# Patient Record
Sex: Female | Born: 1995 | Race: Black or African American | Hispanic: No | Marital: Single | Smoking: Never smoker
Health system: Southern US, Community
[De-identification: ages and names within clinical notes are randomized; demographics above are authoritative.]

## PROBLEM LIST (undated history)

## (undated) DIAGNOSIS — D500 Iron deficiency anemia secondary to blood loss (chronic): Secondary | ICD-10-CM

## (undated) DIAGNOSIS — F419 Anxiety disorder, unspecified: Secondary | ICD-10-CM

## (undated) DIAGNOSIS — F32A Depression, unspecified: Secondary | ICD-10-CM

## (undated) DIAGNOSIS — R0602 Shortness of breath: Secondary | ICD-10-CM

## (undated) DIAGNOSIS — L509 Urticaria, unspecified: Secondary | ICD-10-CM

## (undated) DIAGNOSIS — G4730 Sleep apnea, unspecified: Secondary | ICD-10-CM

## (undated) DIAGNOSIS — R7303 Prediabetes: Secondary | ICD-10-CM

## (undated) HISTORY — DX: Anxiety disorder, unspecified: F41.9

## (undated) HISTORY — DX: Prediabetes: R73.03

## (undated) HISTORY — DX: Urticaria, unspecified: L50.9

## (undated) HISTORY — DX: Sleep apnea, unspecified: G47.30

## (undated) HISTORY — DX: Iron deficiency anemia secondary to blood loss (chronic): D50.0

## (undated) HISTORY — PX: CLEFT LIP REPAIR: SUR1164

## (undated) HISTORY — DX: Shortness of breath: R06.02

## (undated) HISTORY — DX: Depression, unspecified: F32.A

---

## 2017-10-24 ENCOUNTER — Other Ambulatory Visit: Payer: Self-pay

## 2017-10-24 ENCOUNTER — Encounter (HOSPITAL_COMMUNITY): Payer: Self-pay

## 2017-10-24 ENCOUNTER — Emergency Department (HOSPITAL_COMMUNITY)
Admission: EM | Admit: 2017-10-24 | Discharge: 2017-10-24 | Disposition: A | Discharge status: Home / Self Care | Payer: Self-pay | Attending: Emergency Medicine | Admitting: Emergency Medicine

## 2017-10-24 DIAGNOSIS — N939 Abnormal uterine and vaginal bleeding, unspecified: Secondary | ICD-10-CM

## 2017-10-24 DIAGNOSIS — D649 Anemia, unspecified: Secondary | ICD-10-CM

## 2017-10-24 LAB — URINALYSIS, ROUTINE W REFLEX MICROSCOPIC
Bilirubin Urine: NEGATIVE
Glucose, UA: NEGATIVE mg/dL
Ketones, ur: NEGATIVE mg/dL
Leukocytes, UA: NEGATIVE
Nitrite: NEGATIVE
Protein, ur: NEGATIVE mg/dL
Specific Gravity, Urine: 1.016 (ref 1.005–1.030)
pH: 6 (ref 5.0–8.0)

## 2017-10-24 LAB — CBC
HCT: 32.5 % — ABNORMAL LOW (ref 36.0–46.0)
Hemoglobin: 7.7 g/dL — ABNORMAL LOW (ref 12.0–15.0)
MCH: 14.8 pg — ABNORMAL LOW (ref 26.0–34.0)
MCHC: 23.7 g/dL — ABNORMAL LOW (ref 30.0–36.0)
MCV: 62.6 fL — ABNORMAL LOW (ref 80.0–100.0)
Platelets: 384 10*3/uL (ref 150–400)
RBC: 5.19 MIL/uL — ABNORMAL HIGH (ref 3.87–5.11)
RDW: 20.4 % — ABNORMAL HIGH (ref 11.5–15.5)
WBC: 8.1 10*3/uL (ref 4.0–10.5)
nRBC: 0.5 % — ABNORMAL HIGH (ref 0.0–0.2)

## 2017-10-24 LAB — COMPREHENSIVE METABOLIC PANEL
ALT: 11 U/L (ref 0–44)
AST: 14 U/L — ABNORMAL LOW (ref 15–41)
Albumin: 3.6 g/dL (ref 3.5–5.0)
Alkaline Phosphatase: 78 U/L (ref 38–126)
Anion gap: 7 (ref 5–15)
BUN: 6 mg/dL (ref 6–20)
CO2: 25 mmol/L (ref 22–32)
Calcium: 9.1 mg/dL (ref 8.9–10.3)
Chloride: 105 mmol/L (ref 98–111)
Creatinine, Ser: 0.61 mg/dL (ref 0.44–1.00)
GFR calc Af Amer: >60 mL/min (ref >60)
GFR calc non Af Amer: >60 mL/min (ref >60)
Glucose, Bld: 92 mg/dL (ref 70–99)
Potassium: 4.2 mmol/L (ref 3.5–5.1)
Sodium: 137 mmol/L (ref 135–145)
Total Bilirubin: 0.7 mg/dL (ref 0.3–1.2)
Total Protein: 7.5 g/dL (ref 6.5–8.1)

## 2017-10-24 LAB — I-STAT BETA HCG BLOOD, ED (MC, WL, AP ONLY): I-stat hCG, quantitative: <5 m[IU]/mL (ref <5)

## 2017-10-24 LAB — LIPASE, BLOOD: Lipase: 21 U/L (ref 11–51)

## 2017-10-24 MED ORDER — FERROUS SULFATE 325 (65 FE) MG PO TABS: 325.0000 mg | ORAL_TABLET | Freq: Every day | ORAL | 0 refills | Status: DC

## 2017-10-24 NOTE — Discharge Instructions (Addendum)
Please read attached information. If you experience any new or worsening signs or symptoms please return to the emergency room for evaluation. Please follow-up with your primary care provider or specialist as discussed.  If your symptoms worsen including bleeding please return immediately.  please use medication prescribed only as directed and discontinue taking if you have any concerning signs or symptoms.

## 2017-10-24 NOTE — ED Provider Notes (Signed)
[facility] EMERGENCY DEPARTMENT Provider Note   CSN: [id number] Arrival date & time: 10/24/17  1508     History   Chief Complaint Chief Complaint  Patient presents with  . Vaginal Bleeding  . Abdominal Pain    HPI Shirley Briggs is a 22 y.o. female.  HPI   22 year old female presents today with complaints of vaginal bleeding.  Patient notes that she has had regular menstrual cycles up until September.  She notes she had a menstrual cycle that started at the beginning of September, but then had another one that started on September 30 first.  She notes these were typical of previous.  She again had a cycle that started today.  She notes that she has very minimal bleeding at this time, noting she has placed a pad but has no blood on it only blood when wiping..  She denies any chest pain or dizziness, denies any shortness of breath.  She denies any abnormal bruising or bleeding, weight loss or weight gain.  She notes she has been slightly more stressed recently.  Patient denies any recent medical care, she has been living in [place] for 2 years and has not seen a provider.  Patient notes she had very minimal epigastric pain earlier today, but has no abdominal pain or pelvic pain presently.  She notes her menstrual cycles are generally not uncomfortable.  History reviewed. No pertinent past medical history.  There are no active problems to display for this patient.   History reviewed. No pertinent surgical history.   OB History   None      Home Medications    Prior to Admission medications   Medication Sig Start Date End Date Taking? Authorizing Provider  ferrous sulfate 325 (65 FE) MG tablet Take 1 tablet (325 mg total) by mouth daily. 10/24/17   Eyvonne Mechanic, PA-C    Family History History reviewed. No pertinent family history.  Social History Social History   Tobacco Use  . Smoking status: Never Smoker  . Smokeless tobacco:  Never Used  Substance Use Topics  . Alcohol use: Never    Frequency: Never  . Drug use: Never     Allergies   Patient has no known allergies.   Review of Systems Review of Systems  All other systems reviewed and are negative.   Physical Exam Updated Vital Signs BP (!) 138/50 (BP Location: Right Arm)   Pulse 90   Temp 98.6 F (37 C) (Oral)   Resp 17   Ht 5\' 11"  (1.803 m)   Wt 106.6 kg   LMP 10/24/2017 (LMP Unknown)   SpO2 100%   BMI 32.78 kg/m   Physical Exam  Constitutional: She is oriented to person, place, and time. She appears well-developed and well-nourished.  HENT:  Head: Normocephalic and atraumatic.  Eyes: Pupils are equal, round, and reactive to light. Conjunctivae are normal. Right eye exhibits no discharge. Left eye exhibits no discharge. No scleral icterus.  Pink mucous membranes no pallor  Neck: Normal range of motion. No JVD present. No tracheal deviation present.  Pulmonary/Chest: Effort normal. No stridor.  Abdominal: Soft. She exhibits no distension and no mass. There is no tenderness. There is no rebound and no guarding. No hernia.  Neurological: She is alert and oriented to person, place, and time. Coordination normal.  Psychiatric: She has a normal mood and affect. Her behavior is normal. Judgment and thought content normal.  Nursing note and vitals reviewed.  ED Treatments / Results  Labs (all labs ordered are listed, but only abnormal results are displayed) Labs Reviewed  COMPREHENSIVE METABOLIC PANEL - Abnormal; Notable for the following components:      Result Value   AST 14 (*)    All other components within normal limits  CBC - Abnormal; Notable for the following components:   RBC 5.19 (*)    Hemoglobin 7.7 (*)    HCT 32.5 (*)    MCV 62.6 (*)    MCH 14.8 (*)    MCHC 23.7 (*)    RDW 20.4 (*)    nRBC 0.5 (*)    All other components within normal limits  URINALYSIS, ROUTINE W REFLEX MICROSCOPIC - Abnormal; Notable for the  following components:   APPearance HAZY (*)    Hgb urine dipstick LARGE (*)    Bacteria, UA FEW (*)    All other components within normal limits  LIPASE, BLOOD  DIFFERENTIAL  I-STAT BETA HCG BLOOD, ED ([facility], [facility], [facility] ONLY)    EKG None  Radiology No results found.  Procedures Procedures (including critical care time)  Medications Ordered in ED Medications - No data to display   Initial Impression / Assessment and Plan / ED Course  I have reviewed the triage vital signs and the nursing notes.  Pertinent labs & imaging results that were available during my care of the patient were reviewed by me and considered in my medical decision making (see chart for details).     Labs: I-STAT beta-hCG, lipase, CMP, CBC, UA  Imaging:  Consults:  Therapeutics:  Discharge Meds: Iron  Assessment/Plan: 22 year old female presents today with complaints of vaginal bleeding.  Patient reports that she has very minimal bleeding presently.  Patient has no abdominal pain, appears to be very well with no acute distress.  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  Patient's work-up significant for hemoglobin of 7.7, she has microcytic anemia.  Patient has not had any laboratory analysis since she was a child per her, so I have no hemoglobin to compare this to.  I do not feel this is acute as patient has no symptoms from this likely chronic in nature.  She is very anxious to leave the emergency room, I have discussed outpatient follow-up with case management patient will be able to seen as an outpatient for repeat evaluation and management.  I have encouraged her to follow-up with the [facility] for any worsening or continued bleeding, she will follow-up  as an outpatient with primary care for repeat laboratory analysis.  If patient continues to bleed and she is unable to follow-up as an outpatient she will return to emergency room for repeat laboratory analysis.  She will be discharged with iron supplements as well.  Patient will be discharged with strict return precautions, she verbalized understanding and agreement to today's plan had no further questions or concerns.   Final Clinical Impressions(s) / ED Diagnoses   Final diagnoses:  Anemia, unspecified type  Vaginal bleeding    ED Discharge Orders         Ordered    ferrous sulfate 325 (65 FE) MG tablet  Daily     10/24/17 1820  Eyvonne Mechanic, PA-C 10/24/17 1823    Raeford Razor, MD 10/25/17 [phone]

## 2017-10-24 NOTE — ED Triage Notes (Signed)
Pt herecomplaining of abdominal pain on the right side that goes into her flank.  Also stating she is having recurrent vaginal bleeding over the last 2 months. Last period ended Monday Oct 7th. Bleeding and pain started again [facility] Oct 10th.  No pain associated with period bleeding that ended Monday.

## 2017-11-06 ENCOUNTER — Ambulatory Visit: Payer: Self-pay | Attending: Family Medicine | Admitting: Physician Assistant

## 2017-11-06 VITALS — BP 122/74 | HR 84 | Temp 98.1°F | Resp 16 | Ht 71.0 in | Wt 284.2 lb

## 2017-11-06 DIAGNOSIS — R5383 Other fatigue: Secondary | ICD-10-CM | POA: Insufficient documentation

## 2017-11-06 DIAGNOSIS — Z09 Encounter for follow-up examination after completed treatment for conditions other than malignant neoplasm: Secondary | ICD-10-CM | POA: Insufficient documentation

## 2017-11-06 DIAGNOSIS — Z79899 Other long term (current) drug therapy: Secondary | ICD-10-CM | POA: Insufficient documentation

## 2017-11-06 DIAGNOSIS — D649 Anemia, unspecified: Secondary | ICD-10-CM

## 2017-11-06 DIAGNOSIS — N920 Excessive and frequent menstruation with regular cycle: Secondary | ICD-10-CM | POA: Insufficient documentation

## 2017-11-06 DIAGNOSIS — N921 Excessive and frequent menstruation with irregular cycle: Secondary | ICD-10-CM

## 2017-11-06 DIAGNOSIS — D509 Iron deficiency anemia, unspecified: Secondary | ICD-10-CM | POA: Insufficient documentation

## 2017-11-06 DIAGNOSIS — Z7689 Persons encountering health services in other specified circumstances: Secondary | ICD-10-CM | POA: Insufficient documentation

## 2017-11-06 HISTORY — DX: Anemia, unspecified: D64.9

## 2017-11-06 MED ORDER — FERROUS SULFATE 325 (65 FE) MG PO TABS: 325.0000 mg | ORAL_TABLET | Freq: Every day | ORAL | 5 refills | Status: DC

## 2017-11-06 NOTE — Progress Notes (Signed)
Patient ID: Shirley Briggs, female   DOB: Dec 04, 1995, 22 y.o.   MRN: [id number]     Shirley Briggs, is a 22 y.o. female  WUJ:[id number]  [id number]  DOB - 02-Feb-1995  Subjective:  Chief Complaint and HPI: Shirley Briggs is a 22 y.o. female here today to establish care and for a follow up visit After being seen in ED 10/24/2017 for heavy menses.  No further bleeding.  Hasn't seen gyn in a while.  No h/o anemia that she knows of.  She is taking iron once daily.  Periods usu regular and last 7 days with the first 2-3 days being moderate and last several days being light.  Pica with Ice for years  From ED note:  22 year old female presents today with complaints of vaginal bleeding.  Patient notes that she has had regular menstrual cycles up until September.  She notes she had a menstrual cycle that started at the beginning of September, but then had another one that started on September 30 first.  She notes these were typical of previous.  She again had a cycle that started today.  She notes that she has very minimal bleeding at this time, noting she has placed a pad but has no blood on it only blood when wiping..  She denies any chest pain or dizziness, denies any shortness of breath.  She denies any abnormal bruising or bleeding, weight loss or weight gain.  She notes she has been slightly more stressed recently.  Patient denies any recent medical care, she has been living in [place] for 2 years and has not seen a provider.  Patient notes she had very minimal epigastric pain earlier today, but has no abdominal pain or pelvic pain presently.  She notes her menstrual cycles are generally not uncomfortable.  From A/P: Assessment/Plan: 22 year old female presents today with complaints of vaginal bleeding.  Patient reports that she has very minimal bleeding presently.  Patient has no abdominal pain, appears to be very well with no acute distress.  Patient is a version, and has never  had pelvic exam previously, she reports very minimal bleeding at this time, I do not feel that proceeding with a pelvic exam would be beneficial in this situation as there is no reports of infectious etiology, and bleeding is minimal and similar to previous.  Patient's work-up significant for hemoglobin of 7.7, she has microcytic anemia.  Patient has not had any laboratory analysis since she was a child per her, so I have no hemoglobin to compare this to.  I do not feel this is acute as patient has no symptoms from this likely chronic in nature.  She is very anxious to leave the emergency room, I have discussed outpatient follow-up with case management patient will be able to seen as an outpatient for repeat evaluation and management.  I have encouraged her to follow-up with the [facility] for any worsening or continued bleeding, she will follow-up as an outpatient with primary care for repeat laboratory analysis.  If patient continues to bleed and she is unable to follow-up as an outpatient she will return to emergency room for repeat laboratory analysis.  She will be discharged with iron supplements as well.  Patient will be discharged with strict return precautions, she verbalized understanding and agreement to today's plan had no further questions or concerns.   ED/Hospital notes reviewed.   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 EENT:  No vision changes, No blurry vision, No hearing changes. No mouth, throat, or ear problems.  Respiratory: No cough, No SOB Cardiac: No CP, no palpitations GI:  No abd pain, No N/V/D. GU: No Urinary s/sx Musculoskeletal: No joint pain Neuro: No headache, no dizziness, no motor weakness.  Skin: No rash Endocrine:  No polydipsia. No polyuria.  Psych: Denies SI/HI  Problem  Anemia    ALLERGIES: No Known Allergies  PAST MEDICAL  HISTORY: History reviewed. No pertinent past medical history.  MEDICATIONS AT HOME: Prior to Admission medications   Medication Sig Start Date End Date Taking? Authorizing Provider  ferrous sulfate 325 (65 FE) MG tablet Take 1 tablet (325 mg total) by mouth daily. 10/24/17   Hedges, Tinnie Gens, PA-C     Objective:  EXAM:   Vitals:   11/06/17 1447  BP: 122/74  Pulse: 84  Resp: 16  Temp: 98.1 F (36.7 C)  TempSrc: Oral  SpO2: 99%  Weight: 284 lb 3.2 oz (128.9 kg)  Height: 5\' 11"  (1.803 m)    General appearance : A&OX3. NAD. Non-toxic-appearing HEENT: Atraumatic and Normocephalic.  PERRLA. EOM intact.  TM clear B. Mouth-MMM, post pharynx WNL w/o erythema, No PND. Neck: supple, no JVD. No cervical lymphadenopathy. No thyromegaly Chest/Lungs:  Breathing-non-labored, Good air entry bilaterally, breath sounds normal without rales, rhonchi, or wheezing  CVS: S1 S2 regular, no murmurs, gallops, rubs  Abdomen: Bowel sounds present, Non tender and not distended with no gaurding, rigidity or rebound. Extremities: Bilateral Lower Ext shows no edema, both legs are warm to touch with = pulse throughout Neurology:  CN II-XII grossly intact, Non focal.   Psych:  TP linear. J/I WNL. Normal speech. Appropriate eye contact and affect.  Skin:  No Rash  Data Review No results found for: HGBA1C   Assessment & Plan   1. Anemia, unspecified type Will check to see if improving.  Taking Iron once daily.  Drink 80-100 ounces of water daily - CBC with Differential/Platelet - Iron, TIBC and Ferritin Panel  2. Fatigue, unspecified type Long discussion(>30 mins face to face) on healthy diet, exercise, and adequate rest.  She eats a lot of carbs in her diet and is overweight.  I have recommended [facility] Diet to her - TSH  3. Encounter for examination following treatment at hospital improving  4. Menorrhagia with irregular cycle stable - Ambulatory referral to Gynecology   Patient have  been counseled extensively about nutrition and exercise  Return in about 2 months (around 01/06/2018) for assign PCP:  recheck anemia.  The patient was given clear instructions to go to ER or return to medical center if symptoms don't improve, worsen or new problems develop. The patient verbalized understanding. The patient was told to call to get lab results if they haven't heard anything in the next week.     Georgian Co, PA-C [facility] [place], [place] [phone]   11/06/2017, 3:12 PM

## 2017-11-06 NOTE — Patient Instructions (Addendum)
Drink 80-100 ounces water daily.    [facility] Diet has helpful guidelines

## 2017-11-07 LAB — CBC WITH DIFFERENTIAL/PLATELET
Basophils Absolute: 0 10*3/uL (ref 0.0–0.2)
Basos: 0 %
EOS (ABSOLUTE): 0.1 10*3/uL (ref 0.0–0.4)
EOS: 2 %
HEMATOCRIT: 29.8 % — AB (ref 34.0–46.6)
Hemoglobin: 7.9 g/dL — ABNORMAL LOW (ref 11.1–15.9)
Immature Grans (Abs): 0 10*3/uL (ref 0.0–0.1)
Immature Granulocytes: 0 %
LYMPHS ABS: 2.1 10*3/uL (ref 0.7–3.1)
Lymphs: 26 %
MCH: 16.2 pg — AB (ref 26.6–33.0)
MCHC: 26.5 g/dL — AB (ref 31.5–35.7)
MCV: 61 fL — AB (ref 79–97)
MONOS ABS: 0.5 10*3/uL (ref 0.1–0.9)
Monocytes: 6 %
Neutrophils Absolute: 5.4 10*3/uL (ref 1.4–7.0)
Neutrophils: 66 %
Platelets: 388 10*3/uL (ref 150–450)
RBC: 4.88 x10E6/uL (ref 3.77–5.28)
RDW: 21.9 % — AB (ref 12.3–15.4)
WBC: 8.2 10*3/uL (ref 3.4–10.8)

## 2017-11-07 LAB — IRON,TIBC AND FERRITIN PANEL
Ferritin: 6 ng/mL — ABNORMAL LOW (ref 15–150)
Iron Saturation: 4 % — CL (ref 15–55)
Iron: 13 ug/dL — ABNORMAL LOW (ref 27–159)
Total Iron Binding Capacity: 369 ug/dL (ref 250–450)
UIBC: 356 ug/dL (ref 131–425)

## 2017-11-07 LAB — TSH: TSH: 1.7 u[IU]/mL (ref 0.450–4.500)

## 2017-11-11 ENCOUNTER — Telehealth: Payer: Self-pay

## 2017-11-11 ENCOUNTER — Telehealth: Payer: Self-pay | Admitting: General Practice

## 2017-11-11 NOTE — Telephone Encounter (Signed)
I have never seen this patient. Please find out if she is talking about the iron pill/ferrous sulfate that was prescribed for her recently. She can take it any time of day. If she only missed one day then that would not have had a big impact on her lab results

## 2017-11-11 NOTE — Telephone Encounter (Signed)
Yes she is talking about the iron pill that prescribed.

## 2017-11-11 NOTE — Telephone Encounter (Signed)
Will forward to pcp. Pt has appointment scheduled with you on 02/06/2018

## 2017-11-11 NOTE — Telephone Encounter (Signed)
Patient called because she wanted to know when she should be taking her pills. She also didn't take her medicine the day she got her blood taken and wanted to know if that would affect the blood test results. Please follow up.

## 2017-11-11 NOTE — Telephone Encounter (Signed)
Contacted pt and went over lab results pt doesn't have any questions or concerns  

## 2017-11-18 ENCOUNTER — Telehealth: Payer: Self-pay | Admitting: General Practice

## 2017-11-18 NOTE — Telephone Encounter (Signed)
Patient called because she would like a refill for ferrous sulfate sent to [facility] on [address].

## 2017-11-19 NOTE — Telephone Encounter (Signed)
She should have an RX at [facility] is she wants its at [facility] she should ask them to transfer it.

## 2017-11-26 ENCOUNTER — Telehealth: Payer: Self-pay | Admitting: General Practice

## 2017-11-26 NOTE — Telephone Encounter (Signed)
Pt came in to request a medication script resent, she states that  -ferrous sulfate 325 (65 FE) MG tablet  Was not received by  -[facility] [phone]#[id number] - [place], [place] - [address] [facility] [address], Please follow up

## 2017-11-27 NOTE — Telephone Encounter (Signed)
Pharmacy did receive prescription, it was filled and returned to stock after 10 days. They will refill the prescription for her today, she should contact them with further questions or concerns.

## 2017-12-03 ENCOUNTER — Encounter: Payer: Self-pay | Admitting: Family Medicine

## 2017-12-03 ENCOUNTER — Ambulatory Visit: Payer: Self-pay | Attending: Family Medicine | Admitting: Family Medicine

## 2017-12-03 VITALS — BP 106/66 | HR 76 | Temp 98.8°F | Resp 18 | Ht 71.0 in | Wt 285.0 lb

## 2017-12-03 DIAGNOSIS — D500 Iron deficiency anemia secondary to blood loss (chronic): Secondary | ICD-10-CM

## 2017-12-03 DIAGNOSIS — N920 Excessive and frequent menstruation with regular cycle: Secondary | ICD-10-CM

## 2017-12-03 DIAGNOSIS — K5900 Constipation, unspecified: Secondary | ICD-10-CM | POA: Insufficient documentation

## 2017-12-03 NOTE — Patient Instructions (Signed)
Iron Deficiency Anemia, Adult Iron-deficiency anemia is when you have a low amount of red blood cells or hemoglobin. This happens because you have too little iron in your body. Hemoglobin carries oxygen to parts of the body. Anemia can cause your body to not get enough oxygen. It may or may not cause symptoms. Follow these instructions at home: Medicines  Take over-the-counter and prescription medicines only as told by your doctor. This includes iron pills (supplements) and vitamins.  If you cannot handle taking iron pills by mouth, ask your doctor about getting iron through: ? A vein (intravenously). ? A shot (injection) into a muscle.  Take iron pills when your stomach is empty. If you cannot handle this, take them with food.  Do not drink milk or take antacids at the same time as your iron pills.  To prevent trouble pooping (constipation), eat fiber or take medicine (stool softener) as told by your doctor. Eating and drinking  Talk with your doctor before changing the foods you eat. He or she may tell you to eat foods that have a lot of iron, such as: ? Liver. ? Lowfat (lean) beef. ? Breads and cereals that have iron added to them (fortified breads and cereals). ? Eggs. ? Dried fruit. ? Dark green, leafy vegetables.  Drink enough fluid to keep your pee (urine) clear or pale yellow.  Eat fresh fruits and vegetables that are high in vitamin C. They help your body to use iron. Foods with a lot of vitamin C include: ? Oranges. ? Peppers. ? Tomatoes. ? Mangoes. General instructions  Return to your normal activities as told by your doctor. Ask your doctor what activities are safe for you.  Keep yourself clean, and keep things clean around you (your surroundings). Anemia can make you get sick more easily.  Keep all follow-up visits as told by your doctor. This is important. Contact a doctor if:  You feel sick to your stomach (nauseous).  You throw up (vomit).  You feel  weak.  You are sweating for no clear reason.  You have trouble pooping, such as: ? Pooping (having a bowel movement) less than 3 times a week. ? Straining to poop. ? Having poop that is hard, dry, or larger than normal. ? Feeling full or bloated. ? Pain in the lower belly. ? Not feeling better after pooping. Get help right away if:  You pass out (faint). If this happens, do not drive yourself to the hospital. Call your local emergency services (911 in the [place]  You have chest pain.  You have shortness of breath that: ? Is very bad. ? Gets worse with physical activity.  You have a fast heartbeat.  You get light-headed when getting up from sitting or lying down. This information is not intended to replace advice given to you by your health care provider. Make sure you discuss any questions you have with your health care provider. Document Released: 02/03/2010 Document Revised: 09/21/2015 Document Reviewed: 09/21/2015 Elsevier Interactive Patient Education  2017 [employer].  Iron Deficiency Anemia, Adult Iron-deficiency anemia is when you have a low amount of red blood cells or hemoglobin. This happens because you have too little iron in your body. Hemoglobin carries oxygen to parts of the body. Anemia can cause your body to not get enough oxygen. It may or may not cause symptoms. Follow these instructions at home: Medicines  Take over-the-counter and prescription medicines only as told by your doctor. This includes iron pills (supplements) and  vitamins.  If you cannot handle taking iron pills by mouth, ask your doctor about getting iron through: ? A vein (intravenously). ? A shot (injection) into a muscle.  Take iron pills when your stomach is empty. If you cannot handle this, take them with food.  Do not drink milk or take antacids at the same time as your iron pills.  To prevent trouble pooping (constipation), eat fiber or take medicine (stool softener) as told by your  doctor. Eating and drinking  Talk with your doctor before changing the foods you eat. He or she may tell you to eat foods that have a lot of iron, such as: ? Liver. ? Lowfat (lean) beef. ? Breads and cereals that have iron added to them (fortified breads and cereals). ? Eggs. ? Dried fruit. ? Dark green, leafy vegetables.  Drink enough fluid to keep your pee (urine) clear or pale yellow.  Eat fresh fruits and vegetables that are high in vitamin C. They help your body to use iron. Foods with a lot of vitamin C include: ? Oranges. ? Peppers. ? Tomatoes. ? Mangoes. General instructions  Return to your normal activities as told by your doctor. Ask your doctor what activities are safe for you.  Keep yourself clean, and keep things clean around you (your surroundings). Anemia can make you get sick more easily.  Keep all follow-up visits as told by your doctor. This is important. Contact a doctor if:  You feel sick to your stomach (nauseous).  You throw up (vomit).  You feel weak.  You are sweating for no clear reason.  You have trouble pooping, such as: ? Pooping (having a bowel movement) less than 3 times a week. ? Straining to poop. ? Having poop that is hard, dry, or larger than normal. ? Feeling full or bloated. ? Pain in the lower belly. ? Not feeling better after pooping. Get help right away if:  You pass out (faint). If this happens, do not drive yourself to the hospital. Call your local emergency services (911 in the [place]  You have chest pain.  You have shortness of breath that: ? Is very bad. ? Gets worse with physical activity.  You have a fast heartbeat.  You get light-headed when getting up from sitting or lying down. This information is not intended to replace advice given to you by your health care provider. Make sure you discuss any questions you have with your health care provider. Document Released: 02/03/2010 Document Revised: 09/21/2015 Document  Reviewed: 09/21/2015 Elsevier Interactive Patient Education  2017 [employer]

## 2017-12-03 NOTE — Progress Notes (Signed)
Subjective:    Patient ID: Shirley Briggs, female    DOB: Aug 07, 1995, 22 y.o.   MRN: [id number]  HPI       22 year old female new to me as a patient but who has been seen on 11/06/2017 by another provider in the clinic in follow-up of ED visit on 10/24/2017 secondary to prolonged menstrual bleeding and patient was found to have anemia with a hemoglobin of 7.7.  At her most recent visit, patient had follow-up CBC and iron studies.  Patient was also referred to GYN in follow-up of menorrhagia.  Patient also had TSH done in follow-up of complaint of fatigue.  TSH was normal.  Patient however had continued anemia with a hemoglobin of 7.9 with MCV of 61 consistent with microcytic anemia.      Patient reports that she does have a history of heavy, prolonged menses.  Patient states her menstrual cycles generally last at least 7 days.  Patient states that her last menses was at the end of last month and therefore she should not have her period until the end of this month.  Patient was contacted and has a GYN appointment in early December.  Patient denies any chest pain, no shortness of breath related to anemia but patient does feels that she sometimes has swelling in her lower legs as well as fatigue.  Patient states that she was notified after her last visit that she needed to increase her iron pills to twice daily use which she is now doing.  Patient is starting to have some constipation related to her use of iron.  Patient denies any nausea.  Patient continues to crave ice.  Patient reports that her mother had issues with anemia but patient does not know the cause.  Patient believes that her mother's anemia was also related to menstrual bleeding as patient's mother did have a hysterectomy.  Patient is not aware of any family history of sickle cell anemia or sickle cell trait.  Patient states that her family history is significant for hypertension and CHF.   Past Medical History:  Diagnosis Date  .  Iron deficiency anemia due to chronic blood loss    Past Surgical History:  Procedure Laterality Date  . NO PAST SURGERIES     Family History  Problem Relation Age of Onset  . Anemia Mother   . Sickle cell anemia Neg Hx   . Sickle cell trait Neg Hx    Social History   Tobacco Use  . Smoking status: Never Smoker  . Smokeless tobacco: Never Used  Substance Use Topics  . Alcohol use: Never    Frequency: Never  . Drug use: Never  No Known Allergies   Review of Systems  Constitutional: Positive for fatigue. Negative for chills and fever.  Respiratory: Negative for cough and shortness of breath.   Cardiovascular: Positive for leg swelling. Negative for chest pain and palpitations.  Gastrointestinal: Negative for abdominal pain, blood in stool, diarrhea and nausea.  Endocrine: Negative for polydipsia, polyphagia and polyuria.  Genitourinary: Positive for menstrual problem. Negative for dysuria, frequency and hematuria.  Musculoskeletal: Negative for arthralgias, back pain, gait problem and joint swelling.  Neurological: Negative for dizziness, light-headedness and headaches.       Objective:   Physical Exam BP 106/66 (BP Location: Left Arm, Patient Position: Sitting, Cuff Size: Large)   Pulse 76   Temp 98.8 F (37.1 C) (Oral)   Resp 18   Ht 5\' 11"  (1.803 m)  Wt 285 lb (129.3 kg)   LMP 11/11/2017   SpO2 99%   BMI 39.75 kg/m Vital signs and nurse's notes reviewed General- obese, young adult female in no acute distress Neck-supple, patient with appearance of thyromegaly versus fat padding in the anterior neck Lungs-clear to auscultation bilaterally Cardiovascular-regular rate and rhythm, no tachycardia Abdomen-truncal obesity, soft and nontender Back-no CVA tenderness Extremities- patient with some mild puffiness/nonpitting edema in the distal lower extremities near the ankles        Assessment & Plan:  1. Iron deficiency anemia due to chronic blood loss Patient  is now taking her iron supplement twice daily.  Patient is encouraged to drink orange juice or take a vitamin C supplement along with her iron in order to increase absorption.  Patient with complaint of some constipation secondary to iron use and patient is encouraged to use an over-the-counter stool softener or laxative with stool softener as needed.  Patient should remain well-hydrated.  Patient will have repeat CBC to see if she has had improvement in her hemoglobin.  Patient reports that she believes she may be scheduled for follow-up appointment here next month which patient can go ahead and keep otherwise patient will be notified regarding the need for lab or office follow-up based on her results from today's blood work - CBC with Differential  2. Menorrhagia with regular cycle Discussed with patient that her heavy menses are the most likely cause of her iron deficiency anemia.  Patient does have upcoming appointment with GYN for further evaluation and treatment.  Patient will have CBC at today's visit in follow-up of heavy menses and anemia. - CBC with Differential  An After Visit Summary was printed and given to the patient.  Return for keep scheduled 1-2 month follow-up.

## 2017-12-04 LAB — CBC WITH DIFFERENTIAL/PLATELET
Basophils Absolute: 0 x10E3/uL (ref 0.0–0.2)
Basos: 0 %
EOS (ABSOLUTE): 0.1 x10E3/uL (ref 0.0–0.4)
Eos: 1 %
Hematocrit: 33.8 % — ABNORMAL LOW (ref 34.0–46.6)
Hemoglobin: 9.2 g/dL — ABNORMAL LOW (ref 11.1–15.9)
Immature Grans (Abs): 0 x10E3/uL (ref 0.0–0.1)
Immature Granulocytes: 0 %
Lymphocytes Absolute: 2 x10E3/uL (ref 0.7–3.1)
Lymphs: 24 %
MCH: 17.6 pg — ABNORMAL LOW (ref 26.6–33.0)
MCHC: 27.2 g/dL — ABNORMAL LOW (ref 31.5–35.7)
MCV: 65 fL — ABNORMAL LOW (ref 79–97)
Monocytes Absolute: 0.4 x10E3/uL (ref 0.1–0.9)
Monocytes: 5 %
Neutrophils Absolute: 5.8 x10E3/uL (ref 1.4–7.0)
Neutrophils: 70 %
Platelets: 393 x10E3/uL (ref 150–450)
RBC: 5.22 x10E6/uL (ref 3.77–5.28)
RDW: 23.6 % — ABNORMAL HIGH (ref 12.3–15.4)
WBC: 8.4 x10E3/uL (ref 3.4–10.8)

## 2017-12-06 ENCOUNTER — Telehealth: Payer: Self-pay | Admitting: *Deleted

## 2017-12-06 NOTE — Telephone Encounter (Signed)
-----   Message from Cain Saupeammie Fulp, MD sent at 12/04/2017  8:02 PM EST ----- Please notify patient that her hemoglobin has improved from 7.9 and is now at 9.2.  Patient should continue twice daily iron therapy and keep her upcoming appointment with gynecology

## 2017-12-06 NOTE — Telephone Encounter (Signed)
Patient verified DOB Patient is aware of iron improving and to continue with 2 supplements daily and follow up with gynecology. No further questions.

## 2017-12-22 ENCOUNTER — Encounter: Payer: Self-pay | Admitting: Physician Assistant

## 2017-12-23 ENCOUNTER — Ambulatory Visit (INDEPENDENT_AMBULATORY_CARE_PROVIDER_SITE_OTHER): Payer: Self-pay | Admitting: Family Medicine

## 2017-12-23 ENCOUNTER — Encounter: Payer: Self-pay | Admitting: Family Medicine

## 2017-12-23 DIAGNOSIS — D500 Iron deficiency anemia secondary to blood loss (chronic): Secondary | ICD-10-CM

## 2017-12-23 NOTE — Progress Notes (Signed)
   Subjective:    Patient ID: Shirley Briggs is a 22 y.o. female presenting with Metrorrhagia  on 12/23/2017  HPI: New patient referral for anemia. Got hgb down to 7.7. Seen in ED and started on iron. Reports cycle lasted 2 days longer than usual one month. Reports cycles are monthly and last 7 days. No change in cycle. Notes that 1st three days is moderate then usually lightens up. She is not sexually active. Works for [employer].  Review of Systems  Constitutional: Negative for chills and fever.  Respiratory: Negative for shortness of breath.   Cardiovascular: Negative for chest pain.  Gastrointestinal: Negative for abdominal pain, nausea and vomiting.  Genitourinary: Negative for dysuria.  Skin: Negative for rash.      Objective:    BP 102/70   Pulse 81   Wt 283 lb 6.4 oz (128.5 kg)   LMP 12/13/2017 (Exact Date)   BMI 39.53 kg/m  Physical Exam  Constitutional: She is oriented to person, place, and time. She appears well-developed and well-nourished. No distress.  HENT:  Head: Normocephalic and atraumatic.  Eyes: No scleral icterus.  Neck: Neck supple.  Cardiovascular: Normal rate.  Pulmonary/Chest: Effort normal.  Abdominal: Soft.  Neurological: She is alert and oriented to person, place, and time.  Skin: Skin is warm and dry.  Psychiatric: She has a normal mood and affect.        Assessment & Plan:   Problem List Items Addressed This Visit      Unprioritized   Anemia    Slowly improving--? Related to menorrhagia--she does not give a hx that sounds like there is a problem with this. Offered OC's, but as she is not currently sexually active, unlikely to need for contraception. If bleeding becomes more irregular or more heavy, this is an option. Could she have beta thal or some other hemoglobinopathy.         Total face-to-face time with patient: 20 minutes. Over 50% of encounter was spent on counseling and coordination of care. Return if symptoms  worsen or fail to improve.  Shirley Briggs 12/23/2017 2:44 PM

## 2017-12-23 NOTE — Patient Instructions (Signed)

## 2017-12-24 ENCOUNTER — Encounter: Payer: Self-pay | Admitting: Family Medicine

## 2017-12-24 ENCOUNTER — Telehealth: Payer: Self-pay | Admitting: Family Medicine

## 2017-12-24 NOTE — Telephone Encounter (Signed)
Pt request refill ferrous sulfate  (IRON) sent to [facility] on [address] in [place]. Pt ph [phone].

## 2017-12-24 NOTE — Assessment & Plan Note (Addendum)
Slowly improving--? Related to menorrhagia--she does not give a hx that sounds like there is a problem with this. Offered OC's, but as she is not currently sexually active, unlikely to need for contraception. If bleeding becomes more irregular or more heavy, this is an option. Could she have beta thal or some other hemoglobinopathy.

## 2017-12-24 NOTE — Telephone Encounter (Signed)
The patient should have enough refills at that [facility] to last until the end of March 2020. She should contact the pharmacy directly. Thank you.

## 2018-02-06 ENCOUNTER — Ambulatory Visit: Payer: Self-pay | Attending: Family Medicine | Admitting: Family Medicine

## 2018-02-06 ENCOUNTER — Encounter: Payer: Self-pay | Admitting: Family Medicine

## 2018-02-06 VITALS — BP 136/69 | HR 101 | Temp 99.2°F | Ht 71.0 in | Wt 296.0 lb

## 2018-02-06 DIAGNOSIS — N920 Excessive and frequent menstruation with regular cycle: Secondary | ICD-10-CM

## 2018-02-06 DIAGNOSIS — D500 Iron deficiency anemia secondary to blood loss (chronic): Secondary | ICD-10-CM

## 2018-02-06 DIAGNOSIS — R635 Abnormal weight gain: Secondary | ICD-10-CM

## 2018-02-06 DIAGNOSIS — M25562 Pain in left knee: Secondary | ICD-10-CM

## 2018-02-06 DIAGNOSIS — J069 Acute upper respiratory infection, unspecified: Secondary | ICD-10-CM

## 2018-02-06 DIAGNOSIS — K0889 Other specified disorders of teeth and supporting structures: Secondary | ICD-10-CM

## 2018-02-06 MED ORDER — IBUPROFEN 600 MG PO TABS: 600.0000 mg | ORAL_TABLET | Freq: Three times a day (TID) | ORAL | 0 refills | Status: DC | PRN

## 2018-02-06 MED ORDER — AMOXICILLIN 500 MG PO CAPS: 500.0000 mg | ORAL_CAPSULE | Freq: Two times a day (BID) | ORAL | 0 refills | Status: AC

## 2018-02-06 NOTE — Patient Instructions (Signed)

## 2018-02-06 NOTE — Progress Notes (Signed)
Subjective:    Patient ID: Shirley Briggs, female    DOB: 09-15-1995, 23 y.o.   MRN: [id number]  HPI        23 yo female who is seen in follow-up of anemia. Patient was seen 12/24/27 by OB/GYN in follow-up of menorrhagia which is thought to be the cause of her anemia. Patient's last CBC was done 12/03/2017 and her Hgb was 9.2 at that time. She is taking iron supplements and feel less fatigued. She was offered oral contraceptives by GYN to help with her heavy menses but she declined as she is sexually active.        She reports that she does have a few other issues at today's visit.  Patient states that she has had recent onset within the past 2 weeks of some pain in her left knee and she sometimes feels as if her knee is slightly swollen.  She denies any injury to her knee.  Knee pain is dull and usually occurs with walking or when she initially gets up from a seated position.  Pain is located across the middle of her kneecap.  Patient does have to do some bending and lifting as she works at [employer].  Pain is about a 3-4 on a 0-to-10 scale.  She has also had some recurrent tooth pain for which she needs to see a dentist.  Patient states that she feels as if there has been some swelling and pain in 1 of her right upper teeth and pain increases with eating/chewing food on the right side of her mouth.  She also feels as if she has had some recent increase in nasal congestion, nonproductive cough which started about 5 days ago.  No fever or chills.  No headaches or dizziness.  Nasal discharge has so far been clear.  Patient also feels that she is gaining weight without any real changes in her diet or activity level.  Patient admits though that she may be eating more fast food and she does not exercise on a regular basis.  Past Medical History:  Diagnosis Date  . Iron deficiency anemia due to chronic blood loss    Past Surgical History:  Procedure Laterality Date  . CLEFT LIP REPAIR      Family History  Problem Relation Age of Onset  . Anemia Mother   . Anemia Maternal Grandmother   . Heart disease Maternal Grandmother   . Sickle cell anemia Neg Hx   . Sickle cell trait Neg Hx    Social History   Tobacco Use  . Smoking status: Never Smoker  . Smokeless tobacco: Never Used  Substance Use Topics  . Alcohol use: Never    Frequency: Never  . Drug use: Never   No Known Allergies  Review of Systems  Constitutional: Positive for fatigue (mild, improved) and unexpected weight change. Negative for chills and fever.  HENT: Positive for congestion, dental problem, postnasal drip and rhinorrhea. Negative for ear pain, nosebleeds, sinus pressure, sinus pain, sore throat and trouble swallowing.   Eyes: Negative for photophobia and visual disturbance.  Respiratory: Positive for cough (mild, non-productive). Negative for shortness of breath.   Cardiovascular: Negative for chest pain and palpitations.  Gastrointestinal: Negative for abdominal pain, blood in stool, constipation, diarrhea and nausea.  Endocrine: Negative for cold intolerance, heat intolerance, polydipsia, polyphagia and polyuria.  Genitourinary: Positive for frequency. Negative for dysuria.  Musculoskeletal: Positive for arthralgias and joint swelling. Negative for gait problem.  Neurological:  Negative for dizziness and headaches.  Hematological: Negative for adenopathy. Does not bruise/bleed easily.  Psychiatric/Behavioral: Negative for dysphoric mood, self-injury and suicidal ideas. The patient is not nervous/anxious.        Objective:   Physical Exam Vitals signs and nursing note reviewed.  Constitutional:      General: She is not in acute distress.    Appearance: Normal appearance. She is obese.     Comments: Has a slight nasal quality, mild hoarseness to speaking voice  HENT:     Head: Normocephalic and atraumatic.     Right Ear: Tympanic membrane, ear canal and external ear normal.     Left Ear:  Tympanic membrane, ear canal and external ear normal.     Ears:     Comments: TM's are slightly dull but landmarks are visible    Nose: Congestion (mild) and rhinorrhea (clear) present.     Mouth/Throat:     Mouth: Mucous membranes are moist.     Pharynx: Oropharynx is clear. Posterior oropharyngeal erythema (mild) present. No oropharyngeal exudate.     Comments: Patient with some edema/erythema of gum surrounding right posterior molar; area tender when touched with tongue depressor Eyes:     Extraocular Movements: Extraocular movements intact.     Conjunctiva/sclera: Conjunctivae normal.  Neck:     Musculoskeletal: Normal range of motion and neck supple.  Cardiovascular:     Rate and Rhythm: Normal rate and regular rhythm.  Pulmonary:     Effort: Pulmonary effort is normal. No respiratory distress.     Breath sounds: Normal breath sounds. No rhonchi.  Abdominal:     General: Bowel sounds are normal.     Tenderness: There is no abdominal tenderness. There is no right CVA tenderness, left CVA tenderness, guarding or rebound.  Musculoskeletal:        General: Tenderness (mld tenderness over the mid and lower patella of the left knee) present.     Right lower leg: No edema.     Left lower leg: No edema.     Comments: No effusion or swelling of the knees and both knee joints are stable and intact  Lymphadenopathy:     Cervical: No cervical adenopathy.  Skin:    General: Skin is warm and dry.  Neurological:     General: No focal deficit present.     Mental Status: She is alert and oriented to person, place, and time.  Psychiatric:        Mood and Affect: Mood normal.        Judgment: Judgment normal.    BP 136/69 (BP Location: Left Arm, Patient Position: Sitting, Cuff Size: Large)   Pulse (!) 101   Temp 99.2 F (37.3 C) (Oral)   Ht 5\' 11"  (1.803 m)   Wt 296 lb (134.3 kg)   LMP 01/12/2018 (Approximate)   SpO2 100%   BMI 41.28 kg/m         Assessment & Plan:  1. Iron  deficiency anemia due to chronic blood loss Patient will have CBC in follow-up of her anemia and patient is to continue the use of iron therapy but will be notified of changes are needed and iron therapy based on today's CBC results.  - CBC with Differential - Ambulatory referral to Social Work-patient with abnormal PHQ 9 score on depression screen and will be referred to social work for further evaluation and treatment if needed.  Patient denies any depression and no suicidal thoughts or ideations  2.  Menorrhagia with regular cycle Patient was seen by OB/GYN since her last visit.  Patient was offered OCPs which patient declined.  Patient feels that her current menses are not as heavy but in the past she has had menses that were especially heavy on the first 3 days and would last for about 7 days.  Patient's note from GYN consult reviewed. - CBC with Differential  3. Tooth pain Patient with complaint of tooth pain and patient is encouraged to follow-up with a dentist.  Prescription provided for ibuprofen and amoxicillin and patient should call or return if symptoms worsen if she is not able to obtain an appointment with a dentist. - ibuprofen (ADVIL,MOTRIN) 600 MG tablet; Take 1 tablet (600 mg total) by mouth every 8 (eight) hours as needed for moderate pain.  Dispense: 60 tablet; Refill: 0 - amoxicillin (AMOXIL) 500 MG capsule; Take 1 capsule (500 mg total) by mouth 2 (two) times daily for 10 days.  Dispense: 20 capsule; Refill: 0  4. Acute pain of left knee Patient with acute left knee pain.  Patient may have some mild patellar tendinitis due to work-related bending.  Patient was provided with prescription for ibuprofen which she may take for the knee pain as well as her tooth ache.  Patient may use cold compresses/ice pack to the knee over protected skin if she notices swelling.  Patient should call or return to clinic if pain does not go away within the next 2 weeks or if she continues to have  recurrent pain and swelling.  5. Upper respiratory tract infection, unspecified type Patient with URI symptoms and she may take over-the-counter medications for congestion/postnasal drainage  6. Weight gain, abnormal Patient feels as if she has had an abnormal amount of weight gain but upon further discussion, patient may also be eating more processed foods and not getting exercise on a regular basis.  We will also check TSH to make sure that she does not have hypothyroidism that is contributing to her weight gain. - TSH  An After Visit Summary was printed and given to the patient.  Allergies as of 02/06/2018   No Known Allergies     Medication List       Accurate as of February 06, 2018 11:59 PM. Always use your most recent med list.        amoxicillin 500 MG capsule Commonly known as:  AMOXIL Take 1 capsule (500 mg total) by mouth 2 (two) times daily for 10 days.   ferrous sulfate 325 (65 FE) MG tablet Take 1 tablet (325 mg total) by mouth daily with breakfast.   ibuprofen 600 MG tablet Commonly known as:  ADVIL,MOTRIN Take 1 tablet (600 mg total) by mouth every 8 (eight) hours as needed for moderate pain.       Return in about 3 months (around 05/08/2018) for anemia.  Follow-up

## 2018-02-07 LAB — CBC WITH DIFFERENTIAL/PLATELET
Basophils Absolute: 0 x10E3/uL (ref 0.0–0.2)
Basos: 0 %
EOS (ABSOLUTE): 0.2 x10E3/uL (ref 0.0–0.4)
Eos: 2 %
Hematocrit: 39.5 % (ref 34.0–46.6)
Hemoglobin: 11.6 g/dL (ref 11.1–15.9)
Immature Grans (Abs): 0 x10E3/uL (ref 0.0–0.1)
Immature Granulocytes: 0 %
Lymphocytes Absolute: 2.3 x10E3/uL (ref 0.7–3.1)
Lymphs: 25 %
MCH: 20.7 pg — ABNORMAL LOW (ref 26.6–33.0)
MCHC: 29.4 g/dL — ABNORMAL LOW (ref 31.5–35.7)
MCV: 70 fL — ABNORMAL LOW (ref 79–97)
Monocytes Absolute: 0.7 x10E3/uL (ref 0.1–0.9)
Monocytes: 8 %
Neutrophils Absolute: 5.7 x10E3/uL (ref 1.4–7.0)
Neutrophils: 65 %
Platelets: 321 x10E3/uL (ref 150–450)
RBC: 5.61 x10E6/uL — ABNORMAL HIGH (ref 3.77–5.28)
RDW: 19.6 % — ABNORMAL HIGH (ref 11.7–15.4)
WBC: 8.9 x10E3/uL (ref 3.4–10.8)

## 2018-02-07 LAB — TSH: TSH: 1.23 u[IU]/mL (ref 0.450–4.500)

## 2018-02-12 ENCOUNTER — Telehealth: Payer: Self-pay | Admitting: *Deleted

## 2018-02-12 NOTE — Telephone Encounter (Signed)
-----   Message from Cain Saupe, MD sent at 02/10/2018 11:03 PM EST ----- Please notify patient of normal TSH

## 2018-02-12 NOTE — Telephone Encounter (Signed)
Patient verified DOB Patient is aware TSH being normal and anemia improving but MCV still showing small cell size so to continue supplement for another 2-3 weeks and complete a recheck in office. No further questions.

## 2018-03-31 ENCOUNTER — Other Ambulatory Visit: Payer: Self-pay | Admitting: Physician Assistant

## 2018-04-23 ENCOUNTER — Other Ambulatory Visit: Payer: Self-pay | Admitting: Physician Assistant

## 2018-05-08 ENCOUNTER — Other Ambulatory Visit: Payer: Self-pay

## 2018-05-08 ENCOUNTER — Ambulatory Visit: Payer: Self-pay | Attending: Family Medicine | Admitting: Family Medicine

## 2018-05-08 ENCOUNTER — Encounter: Payer: Self-pay | Admitting: Family Medicine

## 2018-05-08 DIAGNOSIS — N920 Excessive and frequent menstruation with regular cycle: Secondary | ICD-10-CM

## 2018-05-08 DIAGNOSIS — D500 Iron deficiency anemia secondary to blood loss (chronic): Secondary | ICD-10-CM

## 2018-05-08 DIAGNOSIS — G479 Sleep disorder, unspecified: Secondary | ICD-10-CM

## 2018-05-08 DIAGNOSIS — J309 Allergic rhinitis, unspecified: Secondary | ICD-10-CM

## 2018-05-08 MED ORDER — CETIRIZINE HCL 10 MG PO TABS: 10.0000 mg | ORAL_TABLET | Freq: Every day | ORAL | 6 refills | Status: DC

## 2018-05-08 NOTE — Progress Notes (Signed)
Would like to talk about her Anemia. Per pt she thinks she have sinus problems because her nose runs a lot   Patient is having sleep problem. Per pt she is having trouble falling asleep

## 2018-05-08 NOTE — Progress Notes (Signed)
Virtual Visit via Telephone Note  I connected with Shirley Briggs on 05/08/18 at  2:10 PM EDT by telephone and verified that I am speaking with the correct person using two identifiers.   I discussed the limitations, risks, security and privacy concerns of performing an evaluation and management service by telephone and the availability of in person appointments. I also discussed with the patient that there may be a patient responsible charge related to this service. The patient expressed understanding and agreed to proceed.  Patient Location: Home Provider Location: Office Others participating in call: [place], CMA who initiated call   History of Present Illness:      23 yo female with issues with heavy menstrual cycles but menses remain regular. She has some mild fatigue but thinks that this is more so related to issues with sleep. Because of the COVID-19 quarantine she has been staying up later and is having trouble with a regular sleep schedule. She is taking iron to help with anemia  But currently only once per day. She has had an increase in nasal allergy symptoms and would like something to help with nasal congestion and postnasal drainage. She denies any sore throat and no fever or chills. No discolored nasal drainage.    Past Medical History:  Diagnosis Date  . Iron deficiency anemia due to chronic blood loss     Past Surgical History:  Procedure Laterality Date  . CLEFT LIP REPAIR      Family History  Problem Relation Age of Onset  . Anemia Mother   . Anemia Maternal Grandmother   . Heart disease Maternal Grandmother   . Sickle cell anemia Neg Hx   . Sickle cell trait Neg Hx     Social History   Tobacco Use  . Smoking status: Never Smoker  . Smokeless tobacco: Never Used  Substance Use Topics  . Alcohol use: Never    Frequency: Never  . Drug use: Never     No Known Allergies   Review of Systems  Constitutional: Positive for malaise/fatigue (mild).  Negative for chills and fever.  HENT: Positive for congestion. Negative for ear pain, sinus pain and sore throat.   Respiratory: Negative for cough and shortness of breath.   Cardiovascular: Negative for chest pain and palpitations.  Gastrointestinal: Negative for abdominal pain, constipation, diarrhea, nausea and vomiting.  Genitourinary: Negative for dysuria and frequency.  Musculoskeletal: Negative for joint pain and myalgias.  Skin: Negative for itching and rash.  Neurological: Negative for dizziness and headaches.  Endo/Heme/Allergies: Negative for polydipsia. Does not bruise/bleed easily.      Observations/Objective: No vital signs or physical exam conducted as visit was done via telephone  Assessment and Plan: 1. Iron deficiency anemia due to chronic blood loss Patient can come in at her convenience in the next few weeks for a CBC in follow-up of anemia. Discussed with patient that her Hgb in Jan of this year was at 11.6 which had improved from 9.2 on 1//19/19.  - CBC with Differential; Future  2. Menorrhagia with regular cycle Patient reports that her periods are not quite as heavy. She is not interested in any interventions/medications at this time for her menorrhagia.  - CBC with Differential; Future  3. Allergic rhinitis, unspecified seasonality, unspecified trigger RX for patient to try cetirizine for her allergy rhinitis symptoms and since this medication can cause drowsiness, she may want to take it in the evenings as this may also help with her current sleep  issues - cetirizine (ZYRTEC) 10 MG tablet; Take 1 tablet (10 mg total) by mouth daily. In the evening to help with nasal congestion/runny nose  Dispense: 30 tablet; Refill: 6  4. Sleeping difficulty Sleep hygiene reviewed with the patient and she was encouraged to establish a routine bedtime and awakening time and make sure that her bedroom is dark and quiet.   Follow Up Instructions:Return for anemia- 6 weeks for  labs.    I discussed the assessment and treatment plan with the patient. The patient was provided an opportunity to ask questions and all were answered. The patient agreed with the plan and demonstrated an understanding of the instructions.   The patient was advised to call back or seek an in-person evaluation if the symptoms worsen or if the condition fails to improve as anticipated.  I provided 12  minutes of non-face-to-face time during this encounter.   Cain Saupeammie Elke Holtry, MD

## 2018-05-17 ENCOUNTER — Encounter: Payer: Self-pay | Admitting: Family Medicine

## 2018-07-17 ENCOUNTER — Telehealth: Payer: Self-pay | Admitting: Family Medicine

## 2018-07-17 NOTE — Telephone Encounter (Signed)
Pt states she hasn't had her menstruation since may 25th has concerns and would like to discuss this with the nurse, I've made an appt already please follow up

## 2018-07-17 NOTE — Telephone Encounter (Signed)
Spoke with patient and she is not having any sx right now but do understand that if she starts to have any bleeding or severe abd pain to go to urgent care or emergency room. Patient did confirmed appt

## 2018-07-30 ENCOUNTER — Ambulatory Visit: Payer: Self-pay | Attending: Family Medicine | Admitting: Family Medicine

## 2018-07-30 ENCOUNTER — Encounter: Payer: Self-pay | Admitting: Family Medicine

## 2018-07-30 ENCOUNTER — Other Ambulatory Visit: Payer: Self-pay

## 2018-07-30 VITALS — BP 114/83 | HR 78 | Temp 98.6°F | Resp 16 | Wt 321.6 lb

## 2018-07-30 DIAGNOSIS — D500 Iron deficiency anemia secondary to blood loss (chronic): Secondary | ICD-10-CM

## 2018-07-30 DIAGNOSIS — N926 Irregular menstruation, unspecified: Secondary | ICD-10-CM

## 2018-07-30 DIAGNOSIS — Z6841 Body Mass Index (BMI) 40.0 and over, adult: Secondary | ICD-10-CM

## 2018-07-30 NOTE — Patient Instructions (Signed)
Polycystic Ovarian Syndrome  Polycystic ovarian syndrome (PCOS) is a common hormonal disorder among women of reproductive age. In most women with PCOS, many small fluid-filled sacs (cysts) grow on the ovaries, and the cysts are not part of a normal menstrual cycle. PCOS can cause problems with your menstrual periods and make it difficult to get pregnant. It can also cause an increased risk of miscarriage with pregnancy. If it is not treated, PCOS can lead to serious health problems, such as diabetes and heart disease. What are the causes? The cause of PCOS is not known, but it may be the result of a combination of certain factors, such as:  Irregular menstrual cycle.  High levels of certain hormones (androgens).  Problems with the hormone that helps to control blood sugar (insulin resistance).  Certain genes. What increases the risk? This condition is more likely to develop in women who have a family history of PCOS. What are the signs or symptoms? Symptoms of PCOS may include:  Multiple ovarian cysts.  Infrequent periods or no periods.  Periods that are too frequent or too heavy.  Unpredictable periods.  Inability to get pregnant (infertility) because of not ovulating.  Increased growth of hair on the face, chest, stomach, back, thumbs, thighs, or toes.  Acne or oily skin. Acne may develop during adulthood, and it may not respond to treatment.  Pelvic pain.  Weight gain or obesity.  Patches of thickened and dark brown or black skin on the neck, arms, breasts, or thighs (acanthosis nigricans).  Excess hair growth on the face, chest, abdomen, or upper thighs (hirsutism). How is this diagnosed? This condition is diagnosed based on:  Your medical history.  A physical exam, including a pelvic exam. Your health care provider may look for areas of increased hair growth on your skin.  Tests, such as: ? Ultrasound. This may be used to examine the ovaries and the lining of the  uterus (endometrium) for cysts. ? Blood tests. These may be used to check levels of sugar (glucose), female hormone (testosterone), and female hormones (estrogen and progesterone) in your blood. How is this treated? There is no cure for PCOS, but treatment can help to manage symptoms and prevent more health problems from developing. Treatment varies depending on:  Your symptoms.  Whether you want to have a baby or whether you need birth control (contraception). Treatment may include nutrition and lifestyle changes along with:  Progesterone hormone to start a menstrual period.  Birth control pills to help you have regular menstrual periods.  Medicines to make you ovulate, if you want to get pregnant.  Medicine to reduce excessive hair growth.  Surgery, in severe cases. This may involve making small holes in one or both of your ovaries. This decreases the amount of testosterone that your body produces. Follow these instructions at home:  Take over-the-counter and prescription medicines only as told by your health care provider.  Follow a healthy meal plan. This can help you reduce the effects of PCOS. ? Eat a healthy diet that includes lean proteins, complex carbohydrates, fresh fruits and vegetables, low-fat dairy products, and healthy fats. Make sure to eat enough fiber.  If you are overweight, lose weight as told by your health care provider. ? Losing 10% of your body weight may improve symptoms. ? Your health care provider can determine how much weight loss is best for you and can help you lose weight safely.  Keep all follow-up visits as told by your health care provider.  This is important. Contact a health care provider if:  Your symptoms do not get better with medicine.  You develop new symptoms. This information is not intended to replace advice given to you by your health care provider. Make sure you discuss any questions you have with your health care provider. Document  Released: 04/27/2004 Document Revised: 12/14/2016 Document Reviewed: 06/19/2015 Elsevier Patient Education  2020 [place].  Secondary Amenorrhea  Secondary amenorrhea occurs when a female who was previously having menstrual periods has not had them for 3-6 months. A menstrual period is the monthly shedding of the lining of the uterus. Menstruation involves the passing of blood, tissue, fluid, and mucus through the vagina. The flow of blood usually occurs during 3-7 consecutive days each month. This condition has many causes. In many cases, treating the underlying cause will return menstrual periods back to a normal cycle. What are the causes? The most common cause of this condition is pregnancy. Other causes include:  Malnutrition.  Cirrhosis of the liver.  Conditions of the blood.  Diabetes.  Epilepsy.  Chronic kidney disease.  Polycystic ovary disease.  Stress or anxiety.  A hormonal imbalance.  Ovarian failure.  Medicines.  Extreme obesity.  Cystic fibrosis.  Low body weight or drastic weight loss.  Early menopause.  Removal of the ovaries or uterus.  Contraceptive pills, patches, or vaginal rings.  Cushing syndrome.  Thyroid problems. What increases the risk? You are more likely to develop this condition if:  You have a family history of this condition.  You have an eating disorder.  You do extreme athletic training.  You have a chronic disease.  You abuse substances such as alcohol or cigarettes. What are the signs or symptoms? The main symptom of this condition is a lack of menstrual periods for 3-6 months. How is this diagnosed? This condition may be diagnosed based on:  Your medical history.  A physical exam.  A pelvic exam to check for problems with your reproductive organs.  A procedure to examine the uterus.  A measurement of your body mass index (BMI).  Tests, such as: ? Blood tests that measure certain hormones in your body  and rule out pregnancy. ? Urine tests. ? Imaging tests, such as an ultrasound, CT scan, or MRI. How is this treated? Treatment for this condition depends on the cause of the amenorrhea. It may involve:  Correcting dietary problems.  Treating underlying conditions.  Medicines.  Lifestyle changes.  Surgery. If the condition cannot be corrected, it is sometimes possible to trigger menstrual periods with medicines. Follow these instructions at home: Lifestyle  Maintain a healthy diet. Ask to meet with a registered dietitian for nutrition counseling and meal planning.  Maintain a healthy weight. Talk to your health care provider before trying any new diet or exercise plan.  Exercise at least 30 minutes 5 or more days each week. Exercising includes brisk walking, yard work, biking, running, swimming, and team sports like basketball and soccer. Ask your health care provider which exercises are safe for you.  Get enough sleep. Plan your sleep time to allow for 7-9 hours of sleep each night.  Learn to manage stress. Explore relaxation techniques such as meditation, journaling, yoga, or tai chi. General instructions  Be aware of changes in your menstrual cycle. Keep a record of when you have your menstrual period. Note the date your period starts, how long it lasts, and any problems you experience.  Take over-the-counter and prescription medicines only as told  by your health care provider.  Keep all follow-up visits as told by your health care provider. This is important. Contact a health care provider if:  Your periods do not return to normal after treatment. Summary  Secondary amenorrhea is when a female who was previously having menstrual periods has not gotten her period for 3-6 months.  This condition has many causes. In many cases, treating the underlying cause will return menstrual periods back to a normal cycle.  Talk to your health care provider if your periods do not  return to normal after treatment. This information is not intended to replace advice given to you by your health care provider. Make sure you discuss any questions you have with your health care provider. Document Released: 02/12/2006 Document Revised: 06/13/2017 Document Reviewed: 03/22/2016 Elsevier Patient Education  2020 [employer].

## 2018-07-30 NOTE — Progress Notes (Signed)
Established Patient Office Visit  Subjective:  Patient ID: Shirley Briggs, female    DOB: 10/17/1995  Age: 23 y.o. MRN: [id number]  CC:  Chief Complaint  Patient presents with  . Dysmenorrhea    HPI [name] presents in follow-up of iron deficiency anemia and patient also with new complaint of irregular menses.  Patient states that she had her period in May but she did not have her period in June.  She states that previously her periods have occurred monthly.  She states that her period recently started for the month of July and lasted for 5 to 7 days but was much lighter than normal.  She reports no possibility of pregnancy as she states that she has not been sexually active.  She denies any vaginal discharge.  No back pain or pelvic pain.  She reports that she has gained a significant amount of weight and she wonders if this could be affecting her menses.  Since her menses has been lighter, she is no longer taking iron pills.  She does not feel as if she is currently anemic.  She reports that she called her GYN regarding her menses but could not get an appointment until next month.  Patient believes that she has gained weight due to increased food intake and decreased activity related to the COVID-19 pandemic.  Past Medical History:  Diagnosis Date  . Iron deficiency anemia due to chronic blood loss     Past Surgical History:  Procedure Laterality Date  . CLEFT LIP REPAIR      Family History  Problem Relation Age of Onset  . Anemia Mother   . Anemia Maternal Grandmother   . Heart disease Maternal Grandmother   . Sickle cell anemia Neg Hx   . Sickle cell trait Neg Hx     Social History   Tobacco Use  . Smoking status: Never Smoker  . Smokeless tobacco: Never Used  Substance Use Topics  . Alcohol use: Never    Frequency: Never  . Drug use: Never    Outpatient Medications Prior to Visit  Medication Sig Dispense Refill  . cetirizine (ZYRTEC) 10 MG  tablet Take 1 tablet (10 mg total) by mouth daily. In the evening to help with nasal congestion/runny nose 30 tablet 6  . ferrous sulfate 325 (65 FE) MG tablet TAKE 1 TABLET BY MOUTH EVERY DAY 30 tablet 2  . ibuprofen (ADVIL,MOTRIN) 600 MG tablet Take 1 tablet (600 mg total) by mouth every 8 (eight) hours as needed for moderate pain. (Patient not taking: Reported on 05/08/2018) 60 tablet 0   No facility-administered medications prior to visit.     No Known Allergies  ROS Review of Systems  Constitutional: Positive for fatigue (Occasional, mild). Negative for chills and fever.  HENT: Negative for sore throat and trouble swallowing.   Eyes: Negative for photophobia and visual disturbance.  Respiratory: Negative for cough and shortness of breath.   Cardiovascular: Negative for chest pain, palpitations and leg swelling.  Gastrointestinal: Negative for abdominal pain, constipation, diarrhea and nausea.  Endocrine: Negative for polydipsia, polyphagia and polyuria.  Genitourinary: Positive for menstrual problem. Negative for dysuria and frequency.  Musculoskeletal: Negative for arthralgias and back pain.  Skin: Negative for rash and wound.  Neurological: Negative for dizziness and headaches.      Objective:    Physical Exam  Constitutional: She is oriented to person, place, and time. She appears well-developed and well-nourished.  Morbidly obese young adult female  in no acute distress sitting on exam table  Neck: Normal range of motion. Neck supple. Thyromegaly (Patient with suggestion of thyromegaly but also has some anterior neck fat padding) present.  Cardiovascular: Normal rate and regular rhythm.  Pulmonary/Chest: Effort normal and breath sounds normal.  Abdominal: Soft. There is no abdominal tenderness. There is no rebound and no guarding.  Truncal obesity  Musculoskeletal:        General: No tenderness or edema.     Comments: No CVA tenderness  Lymphadenopathy:    She has no  cervical adenopathy.  Neurological: She is alert and oriented to person, place, and time.  Skin: Skin is warm and dry.  Psychiatric: She has a normal mood and affect. Her behavior is normal.  Nursing note and vitals reviewed.   BP 114/83   Pulse 78   Temp 98.6 F (37 C) (Oral)   Resp 16   Wt (!) 321 lb 9.6 oz (145.9 kg)   SpO2 99%   BMI 44.85 kg/m  Wt Readings from Last 3 Encounters:  07/30/18 (!) 321 lb 9.6 oz (145.9 kg)  02/06/18 296 lb (134.3 kg)  12/23/17 283 lb 6.4 oz (128.5 kg)     Health Maintenance Due  Topic Date Due  . HIV Screening  12/13/2010  . TETANUS/TDAP  12/13/2014  . PAP-Cervical Cytology Screening  12/12/2016  . PAP SMEAR-Modifier  12/12/2016    Lab Results  Component Value Date   TSH 1.230 02/06/2018   Lab Results  Component Value Date   WBC 8.9 02/06/2018   HGB 11.6 02/06/2018   HCT 39.5 02/06/2018   MCV 70 (L) 02/06/2018   PLT 321 02/06/2018   Lab Results  Component Value Date   NA 137 10/24/2017   K 4.2 10/24/2017   CO2 25 10/24/2017   GLUCOSE 92 10/24/2017   BUN 6 10/24/2017   CREATININE 0.61 10/24/2017   BILITOT 0.7 10/24/2017   ALKPHOS 78 10/24/2017   AST 14 (L) 10/24/2017   ALT 11 10/24/2017   PROT 7.5 10/24/2017   ALBUMIN 3.6 10/24/2017   CALCIUM 9.1 10/24/2017   ANIONGAP 7 10/24/2017   No results found for: CHOL No results found for: HDL No results found for: LDLCALC No results found for: TRIG No results found for: CHOLHDL No results found for: [id number]    Assessment & Plan:  1. Iron deficiency anemia due to chronic blood loss Patient with prior issues with iron deficiency anemia secondary to chronic blood loss.  Will check CBC at today's visit and notify patient if any iron therapy is needed based on the results - CBC with Differential  2. Irregular menses Patient with complaint of irregularities in her menstrual cycle.  Patient has gained more than 20 pounds since she was seen in January.  Discussed with the  patient that changes in weight can affect the menses/menstrual cycle such as obesity or sudden weight loss.  Also provided information as part of AVS on PCOS and secondary amenorrhea.  Will check TSH/T4 to see if thyroid disorder may be contributing to her weight gain as well as menstrual irregularities.  She reports that she has scheduled follow-up appointment with GYN - T4 AND TSH  3. Morbid obesity with BMI of 40.0-44.9, adult [facility](HCC) Patient has gained more than 20 pounds since her January visit.  Dietary changes and regular exercise recommended to help with weight loss.  Will check TSH/T4 to make sure that thyroid disorder is not contributing to her weight gain.  An After Visit Summary was printed and given to the patient.  Follow-up: Keep upcoming appointment with GYN   Cain Saupeammie Raynald Rouillard, MD

## 2018-07-31 LAB — CBC WITH DIFFERENTIAL/PLATELET
Basophils Absolute: 0 x10E3/uL (ref 0.0–0.2)
Basos: 0 %
EOS (ABSOLUTE): 0.2 x10E3/uL (ref 0.0–0.4)
Eos: 2 %
Hematocrit: 41.2 % (ref 34.0–46.6)
Hemoglobin: 13 g/dL (ref 11.1–15.9)
Immature Grans (Abs): 0 x10E3/uL (ref 0.0–0.1)
Immature Granulocytes: 0 %
Lymphocytes Absolute: 2.2 x10E3/uL (ref 0.7–3.1)
Lymphs: 22 %
MCH: 26 pg — ABNORMAL LOW (ref 26.6–33.0)
MCHC: 31.6 g/dL (ref 31.5–35.7)
MCV: 82 fL (ref 79–97)
Monocytes Absolute: 0.4 x10E3/uL (ref 0.1–0.9)
Monocytes: 4 %
Neutrophils Absolute: 7.2 x10E3/uL — ABNORMAL HIGH (ref 1.4–7.0)
Neutrophils: 72 %
Platelets: 303 x10E3/uL (ref 150–450)
RBC: 5 x10E6/uL (ref 3.77–5.28)
RDW: 13.9 % (ref 11.7–15.4)
WBC: 10 x10E3/uL (ref 3.4–10.8)

## 2018-07-31 LAB — T4 AND TSH
T4, Total: 9.4 ug/dL (ref 4.5–12.0)
TSH: 1.7 u[IU]/mL (ref 0.450–4.500)

## 2018-08-01 ENCOUNTER — Telehealth: Payer: Self-pay | Admitting: *Deleted

## 2018-08-01 NOTE — Telephone Encounter (Signed)
-----   Message from Antony Blackbird, MD sent at 08/01/2018  8:33 AM EDT ----- Normal complete blood count-no anemia. Normal thyroid blood work.

## 2018-08-01 NOTE — Telephone Encounter (Signed)
Patient verified DOB Patient is aware of labs being normal, no anemia or TSH concerns.

## 2018-08-07 ENCOUNTER — Telehealth: Payer: Self-pay | Admitting: Family Medicine

## 2018-08-07 ENCOUNTER — Other Ambulatory Visit: Payer: Self-pay | Admitting: Family Medicine

## 2018-08-07 DIAGNOSIS — N938 Other specified abnormal uterine and vaginal bleeding: Secondary | ICD-10-CM

## 2018-08-07 MED ORDER — MEDROXYPROGESTERONE ACETATE 10 MG PO TABS: 10.0000 mg | ORAL_TABLET | Freq: Every day | ORAL | 0 refills | Status: DC

## 2018-08-07 NOTE — Progress Notes (Signed)
Patient ID: Shirley Briggs, female   DOB: 04/03/1995, 23 y.o.   MRN: [id number]   Patient left phone message regarding onset of heavy menses for the past 7 days.  Patient will be notified the prescription will be sent in for her to take Provera 10 mg once daily for 7 days which should stop her bleeding and she should also keep her upcoming follow-up with GYN on call to see if she can obtain a sooner appointment

## 2018-08-07 NOTE — Telephone Encounter (Signed)
Pt called stating her Period started and is very heavy. Today is day 7 and it is still very heavy. Pt request return call  Pt [phone]

## 2018-08-07 NOTE — Telephone Encounter (Signed)
Will route to PCP for review. 

## 2018-08-07 NOTE — Telephone Encounter (Signed)
Please let patient know that a medication is being sent to her pharmacy for a medication, Provera to take once per day for 7 days that should stop her current bleeding and she may wish to call and see if she can get a sooner appointment with GYN (has appointment 08/18/2018)

## 2018-08-08 NOTE — Telephone Encounter (Signed)
Patient was called and informed of medication being sent to pharmacy. 

## 2018-08-12 ENCOUNTER — Telehealth: Payer: Self-pay | Admitting: Family Medicine

## 2018-08-12 ENCOUNTER — Other Ambulatory Visit: Payer: Self-pay | Admitting: Family Medicine

## 2018-08-12 ENCOUNTER — Ambulatory Visit (INDEPENDENT_AMBULATORY_CARE_PROVIDER_SITE_OTHER)
Admission: RE | Admit: 2018-08-12 | Discharge: 2018-08-12 | Disposition: A | Discharge status: Home / Self Care | Payer: Self-pay | Source: Ambulatory Visit

## 2018-08-12 ENCOUNTER — Ambulatory Visit: Payer: Self-pay

## 2018-08-12 DIAGNOSIS — N938 Other specified abnormal uterine and vaginal bleeding: Secondary | ICD-10-CM

## 2018-08-12 DIAGNOSIS — N939 Abnormal uterine and vaginal bleeding, unspecified: Secondary | ICD-10-CM

## 2018-08-12 NOTE — Telephone Encounter (Signed)
Incoming call from Patient  Stating that her cycles have been irregular lately .  Have been light , now she reprts that she has bee on for 12 days.  Dr prescribes  Provera and Patient is afraid to take it.  Related to the amount patient states that she is bleeding recommended that she go to ed for further evaluation.   Reason for Disposition . SEVERE vaginal bleeding (e.g., soaking 2 pads or tampons per hour and present 2 or more hours; 1 menstrual cup every 2 hours)  Answer Assessment - Initial Assessment Questions 1. AMOUNT: "Describe the bleeding that you are having."    - SPOTTING: spotting, or pinkish / brownish mucous discharge; does not fill panti-liner or pad    - MILD:  less than 1 pad / hour; less than patient's usual menstrual bleeding   - MODERATE: 1-2 pads / hour; 1 menstrual cup every 6 hours; small-medium blood clots (e.g., pea, grape, small coin)   - SEVERE: soaking 2 or more pads/hour for 2 or more hours; 1 menstrual cup every 2 hours; bleeding not contained by pads or continuous red blood from vagina; large blood clots (e.g., golf ball, large coin)      Blood clots 2. ONSET: "When did the bleeding begin?" "Is it continuing now?"    July 15th 3. MENSTRUAL PERIOD: "When was the last normal menstrual period?" "How is this different than your period?"     ligh 4. REGULARITY: "How regular are your periods?"      5. ABDOMINAL PAIN: "Do you have any pain?" "How bad is the pain?"  (e.g., Scale 1-10; mild, moderate, or severe)moderate  - MILD (1-3): doesn't interfere with normal activities, abdomen soft and not tender to touch    - MODERATE (4-7): interferes with normal activities or awakens from sleep, tender to touch    - SEVERE (8-10): excruciating pain, doubled over, unable to do any normal activities      Pain is rated mild to moderately 6. PREGNANCY: "Could you be pregnant?" "Are you sexually active?" "Did you recently give birth?"     denies 7. BREASTFEEDING: "Are you  breastfeeding?"     8. HORMONES: "Are you taking any hormone medications, prescription or OTC?" (e.g., birth control pills, estrogen)     *No Answer* 9. BLOOD THINNERS: "Do you take any blood thinners?" (e.g., Coumadin/warfarin, Pradaxa/dabigatran, aspirin)     *No Answer* 10. CAUSE: "What do you think is causing the bleeding?" (e.g., recent gyn surgery, recent gyn procedure; known bleeding disorder, cervical cancer, polycystic ovarian disease, fibroids)         *No Answer* 11. HEMODYNAMIC STATUS: "Are you weak or feeling lightheaded?" If so, ask: "Can you stand and walk normally?"        *No Answer* 12. OTHER SYMPTOMS: "What other symptoms are you having with the bleeding?" (e.g., passed tissue, vaginal discharge, fever, menstrual-type cramps)       *No Answer*  Protocols used: VAGINAL BLEEDING - ABNORMAL-A-AH

## 2018-08-12 NOTE — Telephone Encounter (Signed)
Pt would like to be called, she's having heavy bleeding..please follow up

## 2018-08-12 NOTE — Telephone Encounter (Signed)
Spoke with patient and informed her with what provider stated and she verbalized understanding. Per pt she talked with Urgent care today and they advised her to take the medication and then f/u with the gyn. Informed patient that she may need to follow their instructions but message will be sent to her PCP to update her as well.

## 2018-08-12 NOTE — ED Provider Notes (Signed)
[facility] Virtual Visit via Video Note:  Shirley Briggs  initiated request for [facility] visit with [facility]. I connected with Shirley Briggs  on 08/12/2018 at 1:55 PM  for a synchronized telemedicine visit using a video enabled HIPPA compliant telemedicine application. I verified that I am speaking with Shirley Briggs  using two identifiers. GambiaBrittany Paulino Cork, PA-C  was physically located in a [facility] site and Shirley Briggs was located at a different location.   The limitations of evaluation and management by telemedicine as well as the availability of in-person appointments were discussed. Patient was informed that she  may incur a bill ( including co-pay) for this virtual visit encounter. Shirley Briggs  expressed understanding and gave verbal consent to proceed with virtual visit.  [id number] 08/12/18 Arrival Time: 1226   WU:[id number]:VAGINAL bleeding  SUBJECTIVE:  Shirley Briggs is a 23 y.o. female who presents with complaint of vaginal bleeding that began 12 days ago, on 07/31/18.  Reports LMP prior to symptoms was on 06/09/18, then had spotting on 07/19/18, and then light bleeding on 07/25/18.  She denies a precipitating event, states she is not sexually active, and has never been sexually active.  Per patient she is a virgin and is not concern for pregnancy or miscarriage.  Describes vaginal bleeding as heavy, soaking through approximately 8-9 pads per day with clots.  Was seen by PCP on 07/30/18 and had blood work done at that time.  Was not anemic and thyroid levels were normal per patient.  Was prescribed provera by PCP, but did not fill medication yet.  She reports similar symptoms in the past with irregular periods and heavy bleeding, and was found to be anemic at that time.  Takes iron pills sporadically now.  She denies lightheadedness, dizziness, fainting, syncope, fever, chills, nausea, chest pain, SOB,  vomiting, abdominal or pelvic pain.    ROS: As per HPI.  All other pertinent ROS negative.     Past Medical History:  Diagnosis Date  . Iron deficiency anemia due to chronic blood loss    Past Surgical History:  Procedure Laterality Date  . CLEFT LIP REPAIR     No Known Allergies No current facility-administered medications on file prior to encounter.    Current Outpatient Medications on File Prior to Encounter  Medication Sig Dispense Refill  . cetirizine (ZYRTEC) 10 MG tablet Take 1 tablet (10 mg total) by mouth daily. In the evening to help with nasal congestion/runny nose 30 tablet 6  . ferrous sulfate 325 (65 FE) MG tablet TAKE 1 TABLET BY MOUTH EVERY DAY 30 tablet 2  . medroxyPROGESTERone (PROVERA) 10 MG tablet Take 1 tablet (10 mg total) by mouth daily. 7 tablet 0     OBJECTIVE:  There were no vitals filed for this visit.  General appearance: alert; no distress Eyes: EOMI grossly HENT: normocephalic; atraumatic Neck: supple with FROM Lungs: normal respiratory effort; speaking in full sentences without difficulty Extremities: moves extremities without difficulty Skin: No obvious rashes Neurologic: No facial asymmetries Psychological: alert and cooperative; normal mood and affect  ASSESSMENT & PLAN:  1. Abnormal vaginal bleeding   2. Dysfunctional uterine bleeding     No orders of the defined types were placed in this encounter.  Push fluids and eat a well-balanced diet of fruits, vegetable and lean meats Take iron pills as prescribed to help prevent anemia Begin provera as prescribed to help with abnormal vaginal bleeding  Follow up with OB/GYN on 08/23/18 for further evaluation and management of dysfunctional uterine bleeding Follow up in person or go to the ED if you have any new or worsening symptoms such as fever, chills, lightheadedness, dizziness, fainting, passing out, vision changes, chest pain, shortness of breath, nausea, vomiting, abdominal or pelvic pain,  symptoms do not improve despite medications, etc...  I discussed the assessment and treatment plan with the patient. The patient was provided an opportunity to ask questions and all were answered. The patient agreed with the plan and demonstrated an understanding of the instructions.   The patient was advised to call back or seek an in-person evaluation if the symptoms worsen or if the condition fails to improve as anticipated.  I provided 15 minutes of non-face-to-face time during this encounter.  [place], PA-C  08/12/2018 1:55 PM       Lestine Box, PA-C 08/12/18 1355

## 2018-08-12 NOTE — Telephone Encounter (Signed)
Patient called stating she's been on her period since July 16 and it is heavy bleeding. Per pt she have not had her period since May 26 and was spotting July 4th. Per pt she is having no cramping and going through approx 1 pad /3hrs.  Patient states she was given medications to stop her period but she is scared to take it and would like to know what to do about the medication and the bleeding.

## 2018-08-12 NOTE — Discharge Instructions (Addendum)
Push fluids and eat a well-balanced diet of fruits, vegetable and lean meats Take iron pills as prescribed to help prevent anemia Begin provera as prescribed to help with abnormal vaginal bleeding Follow up with OB/GYN on 08/23/18 for further evaluation and management of dysfunctional uterine bleeding Follow up in person or go to the ED if you have any new or worsening symptoms such as fever, chills, lightheadedness, dizziness, fainting, passing out, vision changes, chest pain, shortness of breath, nausea, vomiting, abdominal or pelvic pain, symptoms do not improve despite medications, etc..Marland Kitchen

## 2018-08-12 NOTE — Telephone Encounter (Signed)
She may want to go to the ED for further evaluation. She was prescribed medication that would help stop her current menses and she has a follow-up appointment scheduled with GYN

## 2018-08-16 ENCOUNTER — Telehealth: Payer: Self-pay

## 2018-08-17 ENCOUNTER — Encounter (HOSPITAL_COMMUNITY): Payer: Self-pay | Admitting: Emergency Medicine

## 2018-08-17 ENCOUNTER — Other Ambulatory Visit: Payer: Self-pay

## 2018-08-17 ENCOUNTER — Emergency Department (HOSPITAL_COMMUNITY)
Admission: EM | Admit: 2018-08-17 | Discharge: 2018-08-17 | Disposition: A | Discharge status: Home / Self Care | Payer: Self-pay | Attending: Emergency Medicine | Admitting: Emergency Medicine

## 2018-08-17 DIAGNOSIS — D509 Iron deficiency anemia, unspecified: Secondary | ICD-10-CM | POA: Insufficient documentation

## 2018-08-17 DIAGNOSIS — N939 Abnormal uterine and vaginal bleeding, unspecified: Secondary | ICD-10-CM | POA: Insufficient documentation

## 2018-08-17 LAB — BASIC METABOLIC PANEL
Anion gap: 8 (ref 5–15)
BUN: 9 mg/dL (ref 6–20)
CO2: 25 mmol/L (ref 22–32)
Calcium: 9.1 mg/dL (ref 8.9–10.3)
Chloride: 104 mmol/L (ref 98–111)
Creatinine, Ser: 0.64 mg/dL (ref 0.44–1.00)
GFR calc Af Amer: >60 mL/min (ref >60)
GFR calc non Af Amer: >60 mL/min (ref >60)
Glucose, Bld: 116 mg/dL — ABNORMAL HIGH (ref 70–99)
Potassium: 4.3 mmol/L (ref 3.5–5.1)
Sodium: 137 mmol/L (ref 135–145)

## 2018-08-17 LAB — CBC WITH DIFFERENTIAL/PLATELET
Abs Immature Granulocytes: 0.03 10*3/uL (ref 0.00–0.07)
Basophils Absolute: 0 10*3/uL (ref 0.0–0.1)
Basophils Relative: 0 %
Eosinophils Absolute: 0.1 10*3/uL (ref 0.0–0.5)
Eosinophils Relative: 2 %
HCT: 38.4 % (ref 36.0–46.0)
Hemoglobin: 11.5 g/dL — ABNORMAL LOW (ref 12.0–15.0)
Immature Granulocytes: 0 %
Lymphocytes Relative: 24 %
Lymphs Abs: 2.1 10*3/uL (ref 0.7–4.0)
MCH: 25.3 pg — ABNORMAL LOW (ref 26.0–34.0)
MCHC: 29.9 g/dL — ABNORMAL LOW (ref 30.0–36.0)
MCV: 84.6 fL (ref 80.0–100.0)
Monocytes Absolute: 0.5 10*3/uL (ref 0.1–1.0)
Monocytes Relative: 5 %
Neutro Abs: 6.3 10*3/uL (ref 1.7–7.7)
Neutrophils Relative %: 69 %
Platelets: 325 10*3/uL (ref 150–400)
RBC: 4.54 MIL/uL (ref 3.87–5.11)
RDW: 14.3 % (ref 11.5–15.5)
WBC: 9.1 10*3/uL (ref 4.0–10.5)
nRBC: 0 % (ref 0.0–0.2)

## 2018-08-17 LAB — I-STAT BETA HCG BLOOD, ED (MC, WL, AP ONLY): I-stat hCG, quantitative: <5 m[IU]/mL

## 2018-08-17 NOTE — ED Provider Notes (Signed)
[facility]   CSN: [id number] Arrival date & time: 08/17/18  1633    History   Chief Complaint Chief Complaint  Patient presents with  . Vaginal Bleeding    HPI Shirley Briggs is a 23 y.o. female.     HPI   Shirley Briggs is a 23 y.o. female, with a history of iron deficiency anemia, presenting to the ED with vaginal bleeding since July 16. Patient underwent urgent care visit July 28 and was prescribed Provera.  She also has a OB/GYN appointment scheduled for July 7. She came to the ED today because she experienced some mild generalized weakness yesterday, which has since resolved.  She has a history of anemia and was concerned about her hemoglobin. Patient was using multiple pads a day, however, most of her heavier bleeding stopped yesterday and today she is only spotting.  Denies dizziness, fever/chills, nausea/vomiting, abdominal pain, syncope, chest pain, shortness of breath, or any other complaints.    Past Medical History:  Diagnosis Date  . Iron deficiency anemia due to chronic blood loss     Patient Active Problem List   Diagnosis Date Noted  . Anemia 11/06/2017    Past Surgical History:  Procedure Laterality Date  . CLEFT LIP REPAIR       OB History   No obstetric history on file.      Home Medications    Prior to Admission medications   Medication Sig Start Date End Date Taking? Authorizing Provider  cetirizine (ZYRTEC) 10 MG tablet Take 1 tablet (10 mg total) by mouth daily. In the evening to help with nasal congestion/runny nose 05/08/18   Fulp, Cammie, MD  ferrous sulfate 325 (65 FE) MG tablet TAKE 1 TABLET BY MOUTH EVERY DAY 08/12/18   Fulp, Cammie, MD  medroxyPROGESTERone (PROVERA) 10 MG tablet Take 1 tablet (10 mg total) by mouth daily. 08/07/18   Cain SaupeFulp, Cammie, MD    Family History Family History  Problem Relation Age of Onset  . Anemia Mother   . Anemia Maternal  Grandmother   . Heart disease Maternal Grandmother   . Sickle cell anemia Neg Hx   . Sickle cell trait Neg Hx     Social History Social History   Tobacco Use  . Smoking status: Never Smoker  . Smokeless tobacco: Never Used  Substance Use Topics  . Alcohol use: Yes    Frequency: Never  . Drug use: Never     Allergies   Patient has no known allergies.   Review of Systems Review of Systems  Constitutional: Negative for chills and fever.  Respiratory: Negative for shortness of breath.   Cardiovascular: Negative for chest pain.  Gastrointestinal: Negative for abdominal pain, nausea and vomiting.  Genitourinary: Positive for vaginal bleeding.  Neurological: Positive for weakness (generalized, resolved). Negative for dizziness, syncope and light-headedness.  All other systems reviewed and are negative.    Physical Exam Updated Vital Signs BP (!) 122/56 (BP Location: Right Arm)   Pulse 85   Temp 99.1 F (37.3 C) (Oral)   Resp 16   Ht 5\' 11"  (1.803 m)   Wt (!) 144 kg   LMP 06/14/2018 Comment: States last regular LMP   SpO2 100%   BMI 44.28 kg/m   Physical Exam Vitals signs and nursing note reviewed.  Constitutional:      General: She is not in acute distress.    Appearance: She is well-developed. She is not diaphoretic.  HENT:     Head: Normocephalic and atraumatic.     Mouth/Throat:     Mouth: Mucous membranes are moist.     Pharynx: Oropharynx is clear.  Eyes:     Conjunctiva/sclera: Conjunctivae normal.  Neck:     Musculoskeletal: Neck supple.  Cardiovascular:     Rate and Rhythm: Normal rate and regular rhythm.     Pulses: Normal pulses.          Radial pulses are 2+ on the right side and 2+ on the left side.       Posterior tibial pulses are 2+ on the right side and 2+ on the left side.     Heart sounds: Normal heart sounds.  Pulmonary:     Effort: Pulmonary effort is normal. No respiratory distress.     Breath sounds: Normal breath sounds.   Abdominal:     Palpations: Abdomen is soft.     Tenderness: There is no abdominal tenderness. There is no guarding.  Musculoskeletal:     Right lower leg: No edema.     Left lower leg: No edema.  Lymphadenopathy:     Cervical: No cervical adenopathy.  Skin:    General: Skin is warm and dry.  Neurological:     Mental Status: She is alert.  Psychiatric:        Mood and Affect: Mood and affect normal.        Speech: Speech normal.        Behavior: Behavior normal.      ED Treatments / Results  Labs (all labs ordered are listed, but only abnormal results are displayed) Labs Reviewed  CBC WITH DIFFERENTIAL/PLATELET - Abnormal; Notable for the following components:      Result Value   Hemoglobin 11.5 (*)    MCH 25.3 (*)    MCHC 29.9 (*)    All other components within normal limits  BASIC METABOLIC PANEL - Abnormal; Notable for the following components:   Glucose, Bld 116 (*)    All other components within normal limits  I-STAT BETA HCG BLOOD, ED ([facility], [facility], [facility] ONLY)    Hemoglobin  Date Value Ref Range Status  08/17/2018 11.5 (L) 12.0 - 15.0 g/dL Final  07/30/2018 13.0 11.1 - 15.9 g/dL Final  02/06/2018 11.6 11.1 - 15.9 g/dL Final  12/03/2017 9.2 (L) 11.1 - 15.9 g/dL Final  11/06/2017 7.9 (L) 11.1 - 15.9 g/dL Final    EKG None  Radiology No results found.  Procedures Procedures (including critical care time)  Medications Ordered in ED Medications - No data to display   Initial Impression / Assessment and Plan / ED Course  I have reviewed the triage vital signs and the nursing notes.  Pertinent labs & imaging results that were available during my care of the patient were reviewed by me and considered in my medical decision making (see chart for details).        Patient presents with concern for anemia in the setting of her vaginal bleeding. Patient is nontoxic appearing, afebrile, not tachycardic, not tachypneic, not hypotensive, maintains excellent SPO2 on  room air, and is in no apparent distress.  Her vaginal bleeding is improving.  Hemoglobin of 11.5, consistent with a value 6 months ago.  She has OB/GYN follow-up already scheduled. Return precautions discussed.  Patient voices understanding of these instructions, accepts the plan, and is comfortable with discharge.  Vitals:   08/17/18 1818 08/17/18 1915 08/17/18 2035 08/17/18 2046  BP: (!) 122/56 124/70 Marland Kitchen)  99/56 116/60  Pulse: 85 82 80   Resp: 16  16   Temp:      TempSrc:      SpO2: 100% 100% 100%   Weight:      Height:         Final Clinical Impressions(s) / ED Diagnoses   Final diagnoses:  Vaginal bleeding    ED Discharge Orders    None       Concepcion LivingJoy, Shawn C, PA-C 08/17/18 2108    Vanetta MuldersZackowski, Scott, MD 08/20/18 0730

## 2018-08-17 NOTE — ED Notes (Signed)
Patient verbalizes understanding of discharge instructions. Opportunity for questioning and answers were provided. Armband removed by staff, pt discharged from ED.  

## 2018-08-17 NOTE — ED Triage Notes (Addendum)
Onset July 16th developed vaginal bleeding changing pads multiple times a day. Spoke with her doctor last week and started a new medication and since then only changing pad once a day. States this week after taking medication feels fatigue.

## 2018-08-17 NOTE — Discharge Instructions (Addendum)
Finish the Provera, as prescribed. Follow-up with the OB/GYN, as planned.

## 2018-08-20 ENCOUNTER — Ambulatory Visit: Payer: Self-pay | Admitting: Obstetrics & Gynecology

## 2018-08-20 ENCOUNTER — Telehealth: Payer: Self-pay | Admitting: Obstetrics & Gynecology

## 2018-08-20 NOTE — Telephone Encounter (Signed)
At [facility] was not had a period for more than a month has not taken a preg test but has not been having sex so knows she is not preg. She would like to talk to a nurse about what she should do. She was late for her appointment today and had to be rescheduled. She stated she urgently wanted to be seen today. Informed the patient of the nurse calling her back or sending a message directly to the MD via mychart.

## 2018-08-21 ENCOUNTER — Telehealth: Payer: Self-pay | Admitting: Family Medicine

## 2018-08-21 ENCOUNTER — Other Ambulatory Visit: Payer: Self-pay | Admitting: Family Medicine

## 2018-08-21 DIAGNOSIS — N938 Other specified abnormal uterine and vaginal bleeding: Secondary | ICD-10-CM

## 2018-08-21 MED ORDER — MEDROXYPROGESTERONE ACETATE 10 MG PO TABS: 10.0000 mg | ORAL_TABLET | Freq: Every day | ORAL | 0 refills | Status: DC

## 2018-08-21 NOTE — Progress Notes (Signed)
Patient ID: Shirley Briggs, female   DOB: 1995-04-09, 23 y.o.   MRN: [id number]   See recent notes and office visits/phone calls.  Call received that patient missed her GYN appointment and this is now been rescheduled for 09/06/2018.  Patient however reports new onset of heavy bleeding.  Patient requesting refill of Provera.  Prescription sent to [facility] and RMA will notify patient and stressed importance of GYN follow-up

## 2018-08-21 NOTE — Telephone Encounter (Signed)
Pt missed her appt with obgyn but she states she really needs to get in to see her obygn, pt would like to know if her nurse can get a sooner appt..please follow up

## 2018-08-21 NOTE — Telephone Encounter (Signed)
Informed patient with what provider stated and she verbalized understanding.  

## 2018-08-21 NOTE — Telephone Encounter (Signed)
Refill of Provera was sent to [facility] and please remind patient of the importance of keeping her follow-up with GYN

## 2018-08-21 NOTE — Telephone Encounter (Signed)
Patient would like to know if provider could please prescribe her more Provera. Per pt she missed her appt with the GYN and it is scheduled for 09-04-18. Patient stated her bleeding started back heavy again.

## 2018-08-22 ENCOUNTER — Ambulatory Visit: Payer: Self-pay | Admitting: Family Medicine

## 2018-09-04 ENCOUNTER — Ambulatory Visit: Payer: Self-pay | Admitting: Obstetrics & Gynecology

## 2018-09-04 ENCOUNTER — Other Ambulatory Visit: Payer: Self-pay

## 2018-09-04 VITALS — BP 127/92 | HR 78 | Temp 98.9°F | Wt 316.4 lb

## 2018-09-04 DIAGNOSIS — N938 Other specified abnormal uterine and vaginal bleeding: Secondary | ICD-10-CM

## 2018-09-04 LAB — HEMOGLOBIN A1C
Est. average glucose Bld gHb Est-mCnc: 114 mg/dL
Hgb A1c MFr Bld: 5.6 % (ref 4.8–5.6)

## 2018-09-04 LAB — POCT PREGNANCY, URINE: Preg Test, Ur: NEGATIVE

## 2018-09-04 MED ORDER — MEDROXYPROGESTERONE ACETATE 10 MG PO TABS: 10.0000 mg | ORAL_TABLET | Freq: Every day | ORAL | 12 refills | Status: DC

## 2018-09-04 NOTE — Progress Notes (Signed)
   Subjective:    Patient ID: Shirley Briggs, female    DOB: [birth year], 23 y.o.   MRN: [id number]  HPI 22 single G0 here today with long standing issues with her periods. Currently she reports that she had a normal period 06/12/18. She had spotting 07/19/18. She then had heavy bleeding for 9 days starting 07/31/18. She took provera for 7 days and then bled again. She took another 7 days of provera, every other day. TSH normal 7/20.  She is not sexually active.   Review of Systems Works at [employer]. She has never had sex.    Objective:   Physical Exam Breathing, conversing, and ambulating normally Well nourished, well hydrated Black female, no apparent distress Abd- benign, morbidly obese      Assessment & Plan:  Irregular periods- I offered OCPs versus cyclic provera She isn't sure what she chooses and will send me a chart message I encouraged Gardasil. She isn't sure so she will send me a message if she wants it. Rec free pap clinic or health dept since she doesn't have any money.  She decided on Provera. She will take it the same 10 days of each month, starting with next bleeding episode.  She will change to OCPs if she decides to become sexually active.  Rec weight loss.

## 2018-09-18 ENCOUNTER — Other Ambulatory Visit: Payer: Self-pay

## 2018-09-18 ENCOUNTER — Ambulatory Visit (HOSPITAL_COMMUNITY)
Admission: RE | Admit: 2018-09-18 | Discharge: 2018-09-18 | Disposition: A | Discharge status: Home / Self Care | Payer: Self-pay | Source: Ambulatory Visit | Attending: Obstetrics & Gynecology | Admitting: Obstetrics & Gynecology

## 2018-09-18 ENCOUNTER — Other Ambulatory Visit: Payer: Self-pay | Admitting: Obstetrics & Gynecology

## 2018-09-18 DIAGNOSIS — N938 Other specified abnormal uterine and vaginal bleeding: Secondary | ICD-10-CM | POA: Insufficient documentation

## 2018-09-24 ENCOUNTER — Ambulatory Visit: Payer: Self-pay | Admitting: Obstetrics & Gynecology

## 2018-11-10 ENCOUNTER — Other Ambulatory Visit: Payer: Self-pay | Admitting: Family Medicine

## 2018-11-10 ENCOUNTER — Ambulatory Visit: Payer: Self-pay

## 2019-01-28 ENCOUNTER — Other Ambulatory Visit: Payer: Medicaid Other

## 2019-01-30 ENCOUNTER — Ambulatory Visit: Payer: Medicaid Other | Attending: Internal Medicine

## 2019-01-30 DIAGNOSIS — Z20822 Contact with and (suspected) exposure to covid-19: Secondary | ICD-10-CM

## 2019-01-31 LAB — NOVEL CORONAVIRUS, NAA: SARS-CoV-2, NAA: NOT DETECTED

## 2019-04-19 ENCOUNTER — Other Ambulatory Visit: Payer: Self-pay | Admitting: Family Medicine

## 2019-07-27 ENCOUNTER — Ambulatory Visit (INDEPENDENT_AMBULATORY_CARE_PROVIDER_SITE_OTHER)
Admission: RE | Admit: 2019-07-27 | Discharge: 2019-07-27 | Disposition: A | Discharge status: Home / Self Care | Payer: Medicaid Other | Source: Ambulatory Visit

## 2019-07-27 ENCOUNTER — Telehealth: Payer: Self-pay | Admitting: Family Medicine

## 2019-07-27 DIAGNOSIS — N939 Abnormal uterine and vaginal bleeding, unspecified: Secondary | ICD-10-CM

## 2019-07-27 MED ORDER — MEGESTROL ACETATE 20 MG PO TABS: 20.0000 mg | ORAL_TABLET | Freq: Every day | ORAL | 0 refills | Status: DC

## 2019-07-27 NOTE — ED Provider Notes (Signed)
Virtual Visit via Video Note:  Shirley Briggs  initiated request for Telemedicine visit with [facility] team. I connected with Shirley Briggs  on 07/27/2019 at 10:57 AM  for a synchronized telemedicine visit using a video enabled HIPPA compliant telemedicine application. I verified that I am speaking with Shirley Briggs  using two identifiers. Durward Parcel, FNP  was physically located in a [facility]gent care site and Takasha Vetere was located at a different location.   The limitations of evaluation and management by telemedicine as well as the availability of in-person appointments were discussed. Patient was informed that she  may incur a bill ( including co-pay) for this virtual visit encounter. Issabelle Manijah Colao  expressed understanding and gave verbal consent to proceed with virtual visit.     History of Present Illness:Shirley Briggs Shirley Briggs  is a 24 y.o. female presents via telehealth with a complaint of abnormal vaginal bleeding for the past 3 to 5 days.  Reported her last period start on June 30 and has not stopped since.  Reports she has been taking Provera 10 mg tablet and has completed yesterday.  Reports she is still having spotting when she wiped herself.  Denies any precipitating event.  Denies dizziness, weakness, fatigue nausea, vomiting, diarrhea, chest pain, shortness of breath.  Past Medical History:  Diagnosis Date  . Iron deficiency anemia due to chronic blood loss     No Known Allergies      Observations/Objective: VITALS: Per patient if applicable, see vitals. GENERAL: Alert, appears well and in no acute distress. HEENT: Atraumatic, conjunctiva clear, no obvious abnormalities on inspection of external nose and ears. NECK: Normal movements of the head and neck. CARDIOPULMONARY: No increased WOB. Speaking in clear sentences. I:E ratio WNL.  MS: Moves all visible extremities without noticeable  abnormality. PSYCH: Pleasant and cooperative, well-groomed. Speech normal rate and rhythm. Affect is appropriate. Insight and judgement are appropriate. Attention is focused, linear, and appropriate.  NEURO: CN grossly intact. Oriented as arrived to appointment on time with no prompting. Moves both UE equally.  SKIN: No obvious lesions, wounds, erythema, or cyanosis noted on face or hands.    Assessment and Plan:    ICD-10-CM   1. Abnormal vaginal bleeding  N93.9    Follow Up Instructions: Call now and follow-up with OB/GYN for further evaluation Return or go to ED for worsening symptoms   I discussed the assessment and treatment plan with the patient. The patient was provided an opportunity to ask questions and all were answered. The patient agreed with the plan and demonstrated an understanding of the instructions.   The patient was advised to call back or seek an in-person evaluation if the symptoms worsen or if the condition fails to improve as anticipated.  I provided 15  minutes of non-face-to-face time during this encounter.    Durward Parcel, FNP  07/27/2019 11:13 AM         Durward Parcel, FNP 07/27/19 1113

## 2019-07-27 NOTE — Discharge Instructions (Addendum)
Call now and follow-up with OB/GYN for further evaluation Return or go to ED for worsening symptoms

## 2019-07-27 NOTE — Telephone Encounter (Signed)
Patient called into to make an appointment for her on going bleeding that she has been having during her period. Patient stated that she has been bleeding for 13 days now and in the past when this has happened it lasted for about a month. Patient stated that she was prescribed some medication to help with the bleeding that she has been taking. Patient wanted to know if she should get a refill on this medication. Patient instructed that a message will be sent to the nurses to let her know what can be done until her appointment on 7/28. Patient verbalized understanding and message sent to clinical pool.

## 2019-07-28 NOTE — Telephone Encounter (Signed)
Attempted to return patients call. Mailbox is full and could not leave a message. Will send My Chart message.

## 2019-08-12 ENCOUNTER — Encounter: Payer: Self-pay | Admitting: Family Medicine

## 2019-08-12 ENCOUNTER — Other Ambulatory Visit: Payer: Self-pay

## 2019-08-12 ENCOUNTER — Ambulatory Visit (INDEPENDENT_AMBULATORY_CARE_PROVIDER_SITE_OTHER): Payer: Self-pay | Admitting: Family Medicine

## 2019-08-12 VITALS — BP 113/83 | HR 77 | Wt 317.7 lb

## 2019-08-12 DIAGNOSIS — N938 Other specified abnormal uterine and vaginal bleeding: Secondary | ICD-10-CM

## 2019-08-12 NOTE — Patient Instructions (Signed)

## 2019-08-12 NOTE — Progress Notes (Signed)
GYNECOLOGY OFFICE VISIT NOTE  History:   Shirley Briggs is a 24 y.o. G0 here today for ongoing irregular menses. She was seen in Aug of 2020 and started on Provera. Reports regular periods since that time but just finished a 3 week period earlier this month although bleeding was light. Reports some concern for diabetes and weight gain. Reports hx of anemia and not currently taking iron supplementation. Denies dizziness, lightheadedness ro craving ice. Reports a lot of stress at work right now but thinks things will improve after a vacation next week. She denies any abnormal vaginal discharge, pelvic pain or other concerns.    Past Medical History:  Diagnosis Date  . Iron deficiency anemia due to chronic blood loss     Past Surgical History:  Procedure Laterality Date  . CLEFT LIP REPAIR      The following portions of the patient's history were reviewed and updated as appropriate: allergies, current medications, past family history, past medical history, past social history, past surgical history and problem list.   Health Maintenance:  Pap due and patient desires to have done at separate visit.   Review of Systems:  Pertinent items noted in HPI and remainder of comprehensive ROS otherwise negative.  Physical Exam:  BP 113/83   Pulse 77   Wt (!) 317 lb 11.2 oz (144.1 kg)   LMP 07/15/2019 (Exact Date)   BMI 44.31 kg/m  CONSTITUTIONAL: Well-developed, well-nourished female in no acute distress. Obese.  HEENT:  Normocephalic, atraumatic. External right and left ear normal. No scleral icterus.  NECK: Normal range of motion, supple, no masses noted on observation SKIN: No rash noted. Not diaphoretic. No erythema. No pallor. MUSCULOSKELETAL: Normal range of motion. No edema noted. NEUROLOGIC: Alert and oriented to person, place, and time. Normal muscle tone coordination. No cranial nerve deficit noted. PSYCHIATRIC: Normal mood and affect. Normal behavior. Normal judgment and  thought content. CARDIOVASCULAR: Normal heart rate noted RESPIRATORY: Effort normal ABDOMEN: No masses noted. No other overt distention noted.   PELVIC: Deferred  Labs and Imaging No results found for this or any previous visit (from the past 168 hour(s)). No results found.    Assessment and Plan:     Shirley Briggs was seen today for menstrual problem.  Diagnoses and all orders for this visit:  DUB (dysfunctional uterine bleeding) - patient with longstanding hx of irregular and heavy periods and saw Dr. Marice Potter in Aug 2020 and was started on Provera - [place] in Sept 2020 normal; hx normal TSH - Reports regular periods since that time with exception of this month when she had a 3 week period with light bleeding - Discussed options. Patient considering IUD and may want when she returns for her annual/Pap in a few weeks - Discussed weight loss - Patient with elevated PHQ9 and GAD7 but reports this is all work related and will improve after her vacation next week; no SI - Patient interested in having another CBC and A1c. A1c normal in Aug 2020, discussed unlikely that this would have changed as she has no other symptoms. Hgb also fairly normal at that time with minimal periods and patient asymptomatic; no indication for repeat today.   Routine preventative health maintenance measures emphasized. Please refer to After Visit Summary for other counseling recommendations.   Return in about 3 years (around 08/12/2022) for Annual with Pap .    Total face-to-face time with patient: 15 minutes.  Over 50% of encounter was spent on counseling and coordination of  care.  Jerilynn Birkenhead, MD [facility] Family Medicine Fellow, [facility] for [facility], [facility]

## 2019-08-13 ENCOUNTER — Encounter: Payer: Self-pay | Admitting: Physician Assistant

## 2019-08-13 ENCOUNTER — Ambulatory Visit: Payer: Self-pay | Attending: Physician Assistant | Admitting: Physician Assistant

## 2019-08-13 DIAGNOSIS — N920 Excessive and frequent menstruation with regular cycle: Secondary | ICD-10-CM

## 2019-08-13 DIAGNOSIS — R739 Hyperglycemia, unspecified: Secondary | ICD-10-CM

## 2019-08-13 DIAGNOSIS — D500 Iron deficiency anemia secondary to blood loss (chronic): Secondary | ICD-10-CM

## 2019-08-13 DIAGNOSIS — Z09 Encounter for follow-up examination after completed treatment for conditions other than malignant neoplasm: Secondary | ICD-10-CM

## 2019-08-13 MED ORDER — FERROUS SULFATE 325 (65 FE) MG PO TABS: 325.0000 mg | ORAL_TABLET | Freq: Every day | ORAL | 1 refills | Status: DC

## 2019-08-13 NOTE — Progress Notes (Signed)
Virtual Visit via Telephone Note  I connected with Shirley Briggs on 08/13/19 at  2:50 PM EDT by telephone and verified that I am speaking with the correct person using two identifiers.   I discussed the limitations, risks, security and privacy concerns of performing an evaluation and management service by telephone and the availability of in person appointments. I also discussed with the patient that there may be a patient responsible charge related to this service. The patient expressed understanding and agreed to proceed.  PATIENT visit by telephone virtually in the context of Covid-19 pandemic. Patient location: home My Location:  [facility] office Persons on the call:  Me and the patient   History of Present Illness: Patient being seen in hospital f/up in regards to heavy menstrual bleeding 07/27/2019.  Bleeding has since stopped.  Also wants to be screened for DM.  Not taking iron.  Hgb not checked since last year.  She is deciding on OCP vs IUD for menorrhagia and will see gyn 08/2019 for pap and to decide.  Currently not taking iron.    Gyn visit yesterday: DUB (dysfunctional uterine bleeding) - patient with longstanding hx of irregular and heavy periods and saw Dr. Marice Potter in Aug 2020 and was started on Provera - [place] in Sept 2020 normal; hx normal TSH - Reports regular periods since that time with exception of this month when she had a 3 week period with light bleeding - Discussed options. Patient considering IUD and may want when she returns for her annual/Pap in a few weeks - Discussed weight loss - Patient with elevated PHQ9 and GAD7 but reports this is all work related and will improve after her vacation next week; no SI - Patient interested in having another CBC and A1c. A1c normal in Aug 2020, discussed unlikely that this would have changed as she has no other symptoms. Hgb also fairly normal at that time with minimal periods and patient asymptomatic; no indication for repeat  today.   Observations/Objective:  NAD.  A&Ox3   Assessment and Plan: 1. Iron deficiency anemia due to chronic blood loss Await lab results before restarting- ferrous sulfate 325 (65 FE) MG tablet; Take 1 tablet (325 mg total) by mouth daily.  Dispense: 90 tablet; Refill: 1 - CBC with Differential/Platelet; Future - Iron, TIBC and Ferritin Panel; Future  2. Hyperglycemia I have had a lengthy discussion and provided education about insulin resistance and the intake of too much sugar/refined carbohydrates.  I have advised the patient to work at a goal of eliminating sugary drinks, candy, desserts, sweets, refined sugars, processed foods, and white carbohydrates.  The patient expresses understanding.   - Hemoglobin A1c; Future  3. Menorrhagia with regular cycle F/up with gyn/keep appt for pap/OCP vs IUD - ferrous sulfate 325 (65 FE) MG tablet; Take 1 tablet (325 mg total) by mouth daily.  Dispense: 90 tablet; Refill: 1 - CBC with Differential/Platelet; Future - Iron, TIBC and Ferritin Panel; Future  4. Encounter for examination following treatment at hospital   Follow Up Instructions: See PCP in 3-4 months/sooner if needed   I discussed the assessment and treatment plan with the patient. The patient was provided an opportunity to ask questions and all were answered. The patient agreed with the plan and demonstrated an understanding of the instructions.   The patient was advised to call back or seek an in-person evaluation if the symptoms worsen or if the condition fails to improve as anticipated.  I provided 13 minutes of  non-face-to-face time during this encounter.   Georgian Co, PA-C  Patient ID: Shirley Briggs, female   DOB: [birth year], 24 y.o.   MRN: [id number]

## 2019-09-11 ENCOUNTER — Ambulatory Visit: Payer: Medicaid Other | Admitting: Obstetrics and Gynecology

## 2020-03-08 ENCOUNTER — Encounter: Payer: Self-pay | Admitting: Nurse Practitioner

## 2020-03-10 ENCOUNTER — Telehealth: Payer: Self-pay

## 2020-03-10 NOTE — Telephone Encounter (Signed)
Copied from CRM [id number]. Topic: General - Other >> Mar 09, 2020 12:15 PM Tamela Oddi wrote: Reason for CRM: Calling to check the status of a My Chart message sent to Dr. Alvis Lemmings.  Patient is stating that she needs this paperwork by Friday completed and she has not heard anything from the doctor.  Please advise and call to discuss at [phone]   Please see patient my chart message to [facility] on behalf of your patient [name] (MRN [id number]). Please advise ASAP.

## 2020-03-10 NOTE — Telephone Encounter (Signed)
Hi my name is Shirley Briggs I recently came by a about 1 week ago and had doctor Newlin sign some papers for me to go on a caregiver leave to help take care of my grandmother. I understand that you are not my grandmother [name]) doctor but doctor newlin is and you are my doctor. I was told at the front desk that I would be able to send you this message and you could pass it on to doctor newlin for me. Doctors newlin signed the wrong part on the form she has to cross an "X" through the whole section of "intermittent leave" and sign her initials next to it and date it. She will have to check box 1 for "continuous leave"and put yes and the date is 03/06/20-04/02/20.if you can please get this message to doctor newlin as soon as possible please I have to have these papers turn back in as soon as possible. Thank you. My phone number is [phone] if you or doctor newlin needs to contact me.

## 2020-03-11 NOTE — Telephone Encounter (Signed)
Completed.

## 2020-03-21 NOTE — Telephone Encounter (Signed)
error 

## 2020-04-08 ENCOUNTER — Telehealth: Payer: Self-pay

## 2020-04-08 NOTE — Telephone Encounter (Signed)
Provider [place] working virtual 3/25. Called Pt no answer.Left vm that appt is virtual. If in person visit is preferred call [phone] to reschedule appt.

## 2020-04-12 ENCOUNTER — Encounter: Payer: Self-pay | Admitting: Nurse Practitioner

## 2020-04-12 ENCOUNTER — Ambulatory Visit: Payer: Self-pay | Attending: Nurse Practitioner | Admitting: Nurse Practitioner

## 2020-04-12 ENCOUNTER — Other Ambulatory Visit: Payer: Self-pay

## 2020-04-12 VITALS — Ht 71.0 in | Wt 323.0 lb

## 2020-04-12 DIAGNOSIS — Z7689 Persons encountering health services in other specified circumstances: Secondary | ICD-10-CM

## 2020-04-12 DIAGNOSIS — Z862 Personal history of diseases of the blood and blood-forming organs and certain disorders involving the immune mechanism: Secondary | ICD-10-CM

## 2020-04-12 DIAGNOSIS — R7309 Other abnormal glucose: Secondary | ICD-10-CM

## 2020-04-12 NOTE — Progress Notes (Signed)
Virtual Visit via Telephone Note Due to national recommendations of social distancing due to COVID 19, telehealth visit is felt to be most appropriate for this patient at this time.  I discussed the limitations, risks, security and privacy concerns of performing an evaluation and management service by telephone and the availability of in person appointments. I also discussed with the patient that there may be a patient responsible charge related to this service. The patient expressed understanding and agreed to proceed.    I connected with Shirley Briggs on 04/12/20  at   3:50 PM EDT  EDT by telephone and verified that I am speaking with the correct person using two identifiers.   Consent I discussed the limitations, risks, security and privacy concerns of performing an evaluation and management service by telephone and the availability of in person appointments. I also discussed with the patient that there may be a patient responsible charge related to this service. The patient expressed understanding and agreed to proceed.   Location of Patient: Private Residence    Location of Provider: [facility]    Persons participating in Telemedicine visit: [name] [place] [place] CMA Dera [place]    History of Present Illness: Telemedicine visit for: Establish Care She has a history of anemia due to menorrhagia. Was told by gyn to stop taking iron after  cycles returned to normal flow. She is experiencing regular menstrual cycles now   Morbid Obesity She is working at [employer] and active at work but does not participate in any formal exercise program She took off for a month when her grandmother was ill and believes her weight increased during that time as well due to stress. She snacks throughout the day and night. Only eats about 2 full meals a day Goal weight 210-220 lbs  She also wants to "get checked" for diabetes. Has been gainiing a  lot of weight.  Lab Results  Component Value Date   HGBA1C 5.6 09/04/2018    BP Readings from Last 3 Encounters:  08/12/19 113/83  09/04/18 (!) 127/92  08/17/18 116/60    Past Medical History:  Diagnosis Date  . Iron deficiency anemia due to chronic blood loss     Past Surgical History:  Procedure Laterality Date  . CLEFT LIP REPAIR      Family History  Problem Relation Age of Onset  . Anemia Mother   . Anemia Maternal Grandmother   . Heart disease Maternal Grandmother   . Sickle cell anemia Neg Hx   . Sickle cell trait Neg Hx     Social History   Socioeconomic History  . Marital status: Single    Spouse name: Not on file  . Number of children: Not on file  . Years of education: Not on file  . Highest education level: Not on file  Occupational History  . Not on file  Tobacco Use  . Smoking status: Never Smoker  . Smokeless tobacco: Never Used  Vaping Use  . Vaping Use: Never used  Substance and Sexual Activity  . Alcohol use: Yes  . Drug use: Never  . Sexual activity: Never  Other Topics Concern  . Not on file  Social History Narrative  . Not on file   Social Determinants of Health   Financial Resource Strain: Not on file  Food Insecurity: No Food Insecurity  . Worried About Programme researcher, broadcasting/film/video in the Last Year: Never true  . Ran Out of Food  in the Last Year: Never true  Transportation Needs: Unmet Transportation Needs  . Lack of Transportation (Medical): Yes  . Lack of Transportation (Non-Medical): Yes  Physical Activity: Not on file  Stress: Not on file  Social Connections: Not on file     Observations/Objective: Awake, alert and oriented x 3   Review of Systems  Constitutional: Negative for fever, malaise/fatigue and weight loss.  HENT: Negative.  Negative for nosebleeds.   Eyes: Negative.  Negative for blurred vision, double vision and photophobia.  Respiratory: Negative.  Negative for cough and shortness of breath.   Cardiovascular:  Negative.  Negative for chest pain, palpitations and leg swelling.  Gastrointestinal: Negative.  Negative for heartburn, nausea and vomiting.  Musculoskeletal: Negative.  Negative for myalgias.  Neurological: Negative.  Negative for dizziness, focal weakness, seizures and headaches.  Psychiatric/Behavioral: Negative.  Negative for suicidal ideas.    Assessment and Plan: Diagnoses and all orders for this visit:  Encounter to establish care  Elevated glucose level -     Hemoglobin A1c; Future -     Basic metabolic panel; Future  History of anemia -     CBC; Future  Morbid obesity with body mass index (BMI) of 40.0 to 49.9 (HCC) -     Hemoglobin A1c; Future -     Basic metabolic panel; Future -     Lipid panel; Future Discussed diet and exercise for person with BMI >45.  Instructions: You must burn more calories than you eat. Losing 5 percent of your body weight should be considered a success. In the longer term, losing more than 15 percent of your body weight and staying at this weight is an extremely good result. However, keep in mind that even losing 5 percent of your body weight leads to important health benefits, so try not to get discouraged if you're not able to lose more than this. Will recheck weight in 3-6 months.   Follow Up Instructions Return if symptoms worsen or fail to improve.     I discussed the assessment and treatment plan with the patient. The patient was provided an opportunity to ask questions and all were answered. The patient agreed with the plan and demonstrated an understanding of the instructions.   The patient was advised to call back or seek an in-person evaluation if the symptoms worsen or if the condition fails to improve as anticipated.  I provided 13 minutes of non-face-to-face time during this encounter including median intraservice time, reviewing previous notes, labs, imaging, medications and explaining diagnosis and management.  Claiborne Rigg,  FNP-BC

## 2020-04-15 ENCOUNTER — Encounter: Payer: Self-pay | Admitting: Nurse Practitioner

## 2020-05-25 IMAGING — US US PELVIS COMPLETE
2 series · 15 of 25 positions shown · non-contrast
Comparison: None

CLINICAL DATA: Dysfunctional uterine bleeding

EXAM:
TRANSABDOMINAL ULTRASOUND OF PELVIS
TECHNIQUE: Transabdominal ultrasound examination of the pelvis was performed
including evaluation of the uterus, ovaries, adnexal regions, and
pelvic cul-de-sac. Transvaginal imaging was not performed.

[Series 1: us pelvis complete · 11 of 26 slices shown (1 of 2)]
[im 1/26]
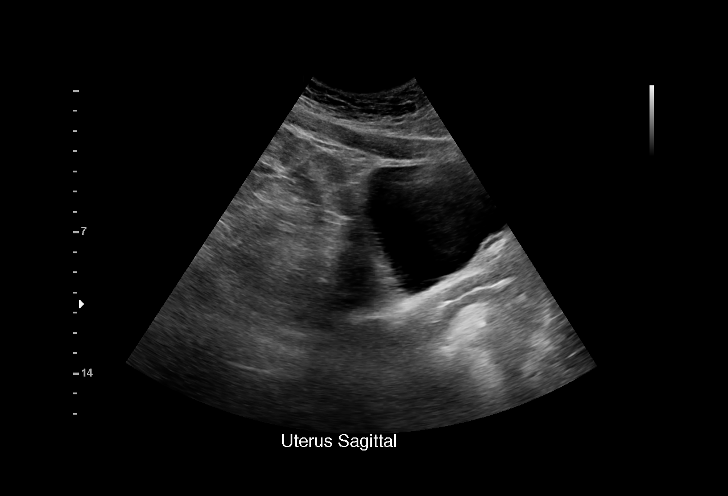
[im 3/26]
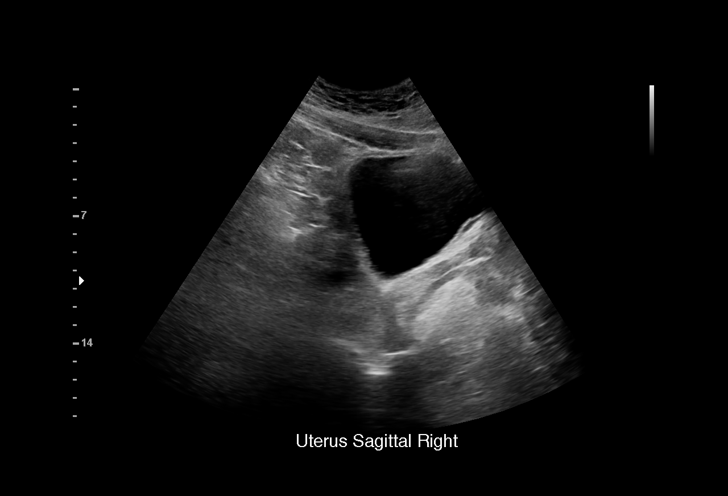
[im 6/26]
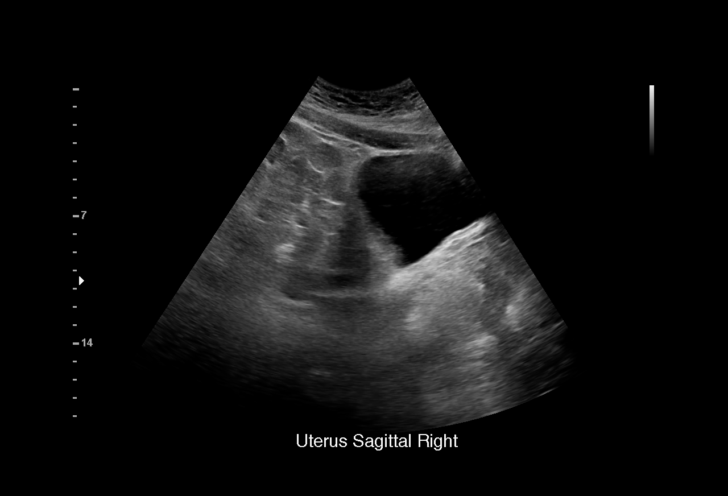
[im 8/26]
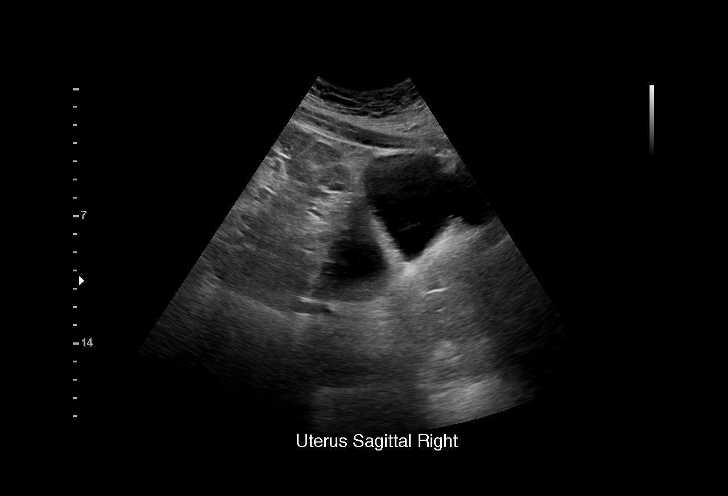
[im 11/26]
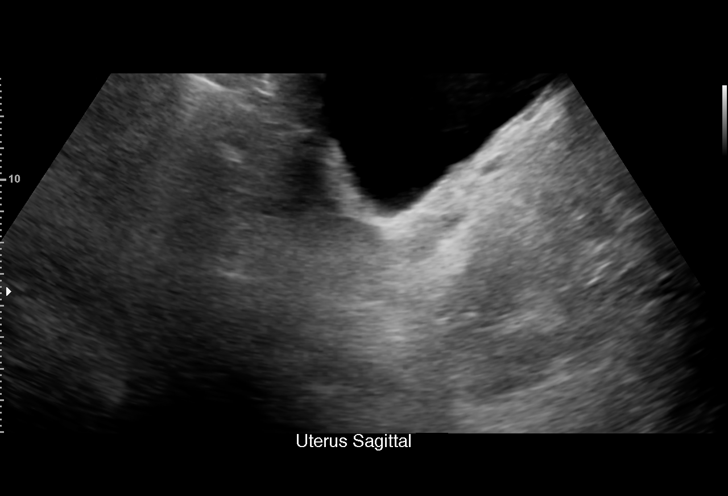
[im 14/26]
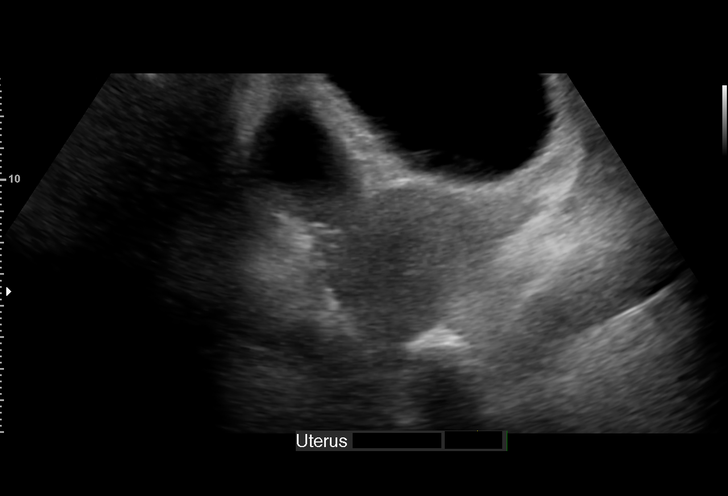
[im 15/26]
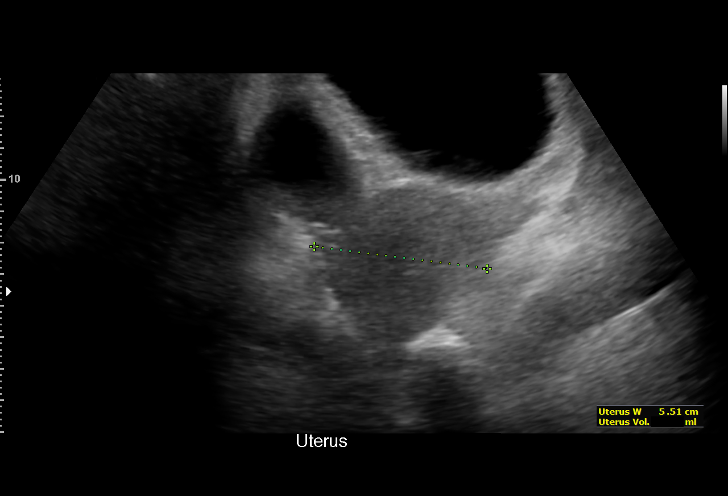
[im 18/26]
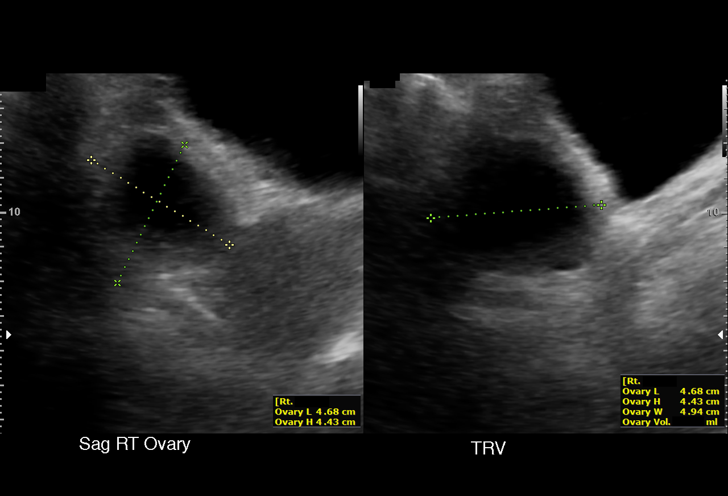
[im 21/26]
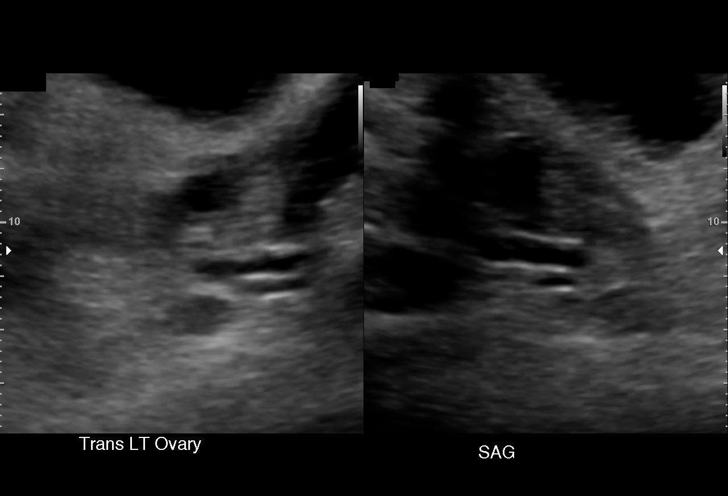
[im 23/26]
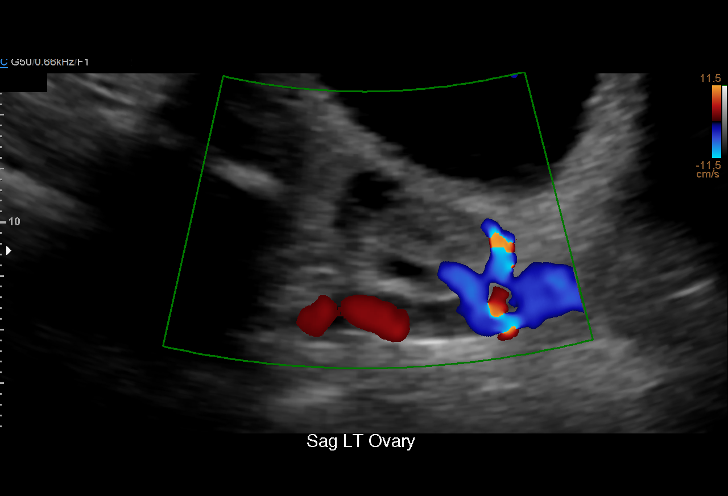
[im 26/26]
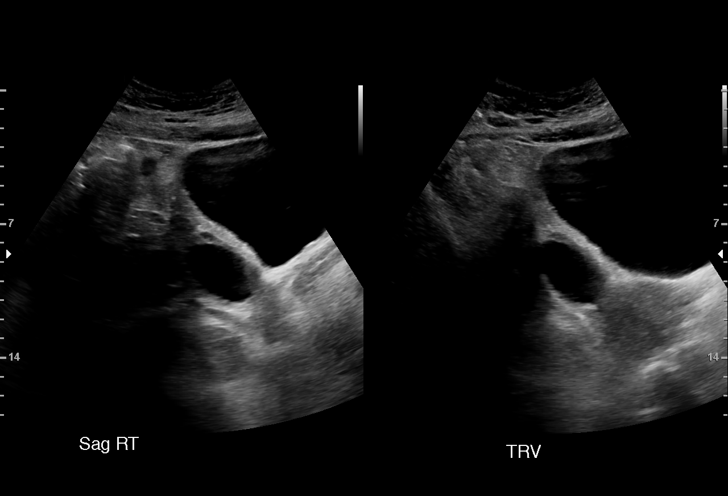

[Series 2: us pelvis complete · 4 of 11 slices shown (2 of 2)]
[im 2/11]
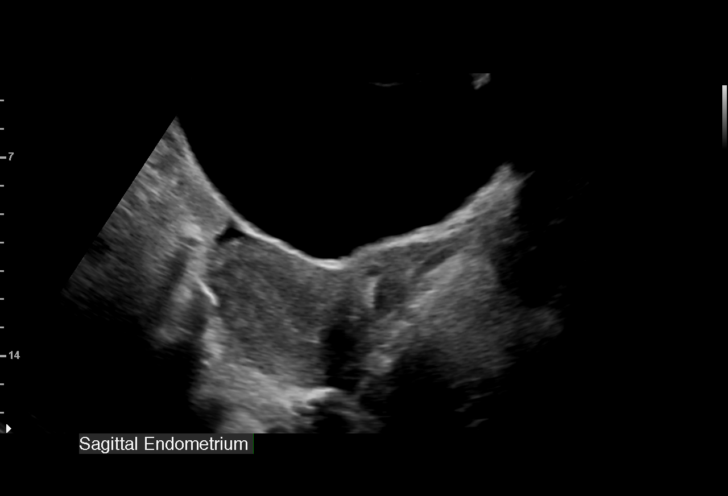
[im 4/11]
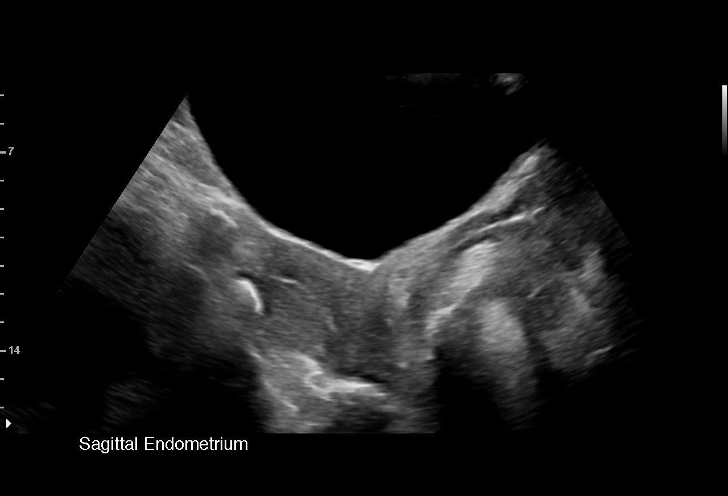
[im 7/11]
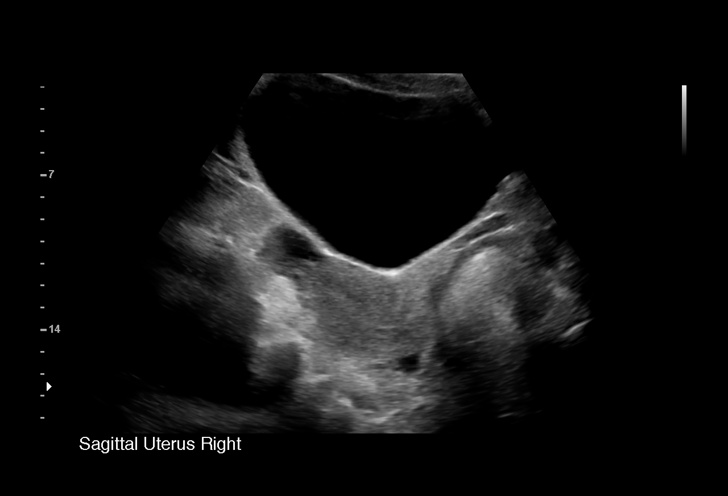
[im 11/11]
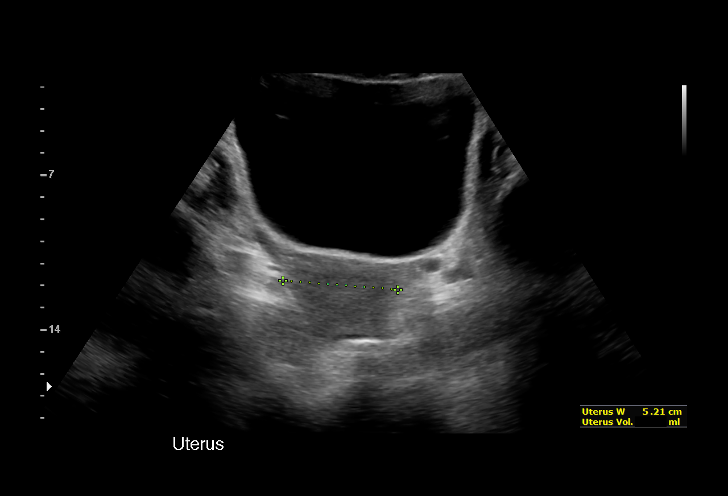

[15 of 25 positions shown; findings below may reference images not displayed]

FINDINGS: Uterus

Measurements: 6.7 x 4.4 x 5.4 cm = volume: 81 mL. Suboptimally
visualized. Anteverted. Normal morphology without mass

Endometrium

Thickness: 3 mm.  No endometrial fluid or focal abnormality

Right ovary

Measurements: 4.7 x 4.4 x 4.9 cm = volume: 54 mL. Simple cyst RIGHT
ovary 3.4 x 3.1 x 2.7 cm. No additional masses.

Left ovary

Measurements: 3.8 x 1.7 x 2.6 cm = volume: 9 mL. Normal morphology
without mass

Other findings:  No free pelvic fluid or other adnexal masses.
IMPRESSION: 3.4 cm diameter simple cyst RIGHT ovary.

Otherwise normal exam.

## 2020-05-26 ENCOUNTER — Other Ambulatory Visit: Payer: Self-pay

## 2020-05-26 ENCOUNTER — Ambulatory Visit: Payer: 59 | Attending: Nurse Practitioner

## 2020-05-26 DIAGNOSIS — R7309 Other abnormal glucose: Secondary | ICD-10-CM

## 2020-05-26 DIAGNOSIS — Z862 Personal history of diseases of the blood and blood-forming organs and certain disorders involving the immune mechanism: Secondary | ICD-10-CM

## 2020-05-27 LAB — LIPID PANEL
Chol/HDL Ratio: 4 ratio (ref 0.0–4.4)
Cholesterol, Total: 147 mg/dL (ref 100–199)
HDL: 37 mg/dL — ABNORMAL LOW (ref >39)
LDL Chol Calc (NIH): 93 mg/dL (ref 0–99)
Triglycerides: 91 mg/dL (ref 0–149)
VLDL Cholesterol Cal: 17 mg/dL (ref 5–40)

## 2020-05-27 LAB — CBC
Hematocrit: 35.5 % (ref 34.0–46.6)
Hemoglobin: 10.7 g/dL — ABNORMAL LOW (ref 11.1–15.9)
MCH: 22.1 pg — ABNORMAL LOW (ref 26.6–33.0)
MCHC: 30.1 g/dL — ABNORMAL LOW (ref 31.5–35.7)
MCV: 73 fL — ABNORMAL LOW (ref 79–97)
Platelets: 347 10*3/uL (ref 150–450)
RBC: 4.84 x10E6/uL (ref 3.77–5.28)
RDW: 14.2 % (ref 11.7–15.4)
WBC: 10.7 10*3/uL (ref 3.4–10.8)

## 2020-05-27 LAB — BASIC METABOLIC PANEL
BUN/Creatinine Ratio: 18 (ref 9–23)
BUN: 11 mg/dL (ref 6–20)
CO2: 23 mmol/L (ref 20–29)
Calcium: 8.9 mg/dL (ref 8.7–10.2)
Chloride: 101 mmol/L (ref 96–106)
Creatinine, Ser: 0.62 mg/dL (ref 0.57–1.00)
Glucose: 89 mg/dL (ref 65–99)
Potassium: 4.7 mmol/L (ref 3.5–5.2)
Sodium: 138 mmol/L (ref 134–144)
eGFR: 127 mL/min/{1.73_m2} (ref >59)

## 2020-05-27 LAB — HEMOGLOBIN A1C
Est. average glucose Bld gHb Est-mCnc: 120 mg/dL
Hgb A1c MFr Bld: 5.8 % — ABNORMAL HIGH (ref 4.8–5.6)

## 2020-06-13 ENCOUNTER — Encounter: Payer: Self-pay | Admitting: Nurse Practitioner

## 2020-07-22 ENCOUNTER — Ambulatory Visit: Payer: Medicaid Other | Admitting: Nurse Practitioner

## 2020-08-12 ENCOUNTER — Encounter: Payer: Self-pay | Admitting: Nurse Practitioner

## 2020-09-12 ENCOUNTER — Encounter: Payer: Self-pay | Admitting: Nurse Practitioner

## 2020-09-12 ENCOUNTER — Other Ambulatory Visit: Payer: Self-pay

## 2020-09-12 ENCOUNTER — Ambulatory Visit: Payer: 59 | Attending: Nurse Practitioner | Admitting: Nurse Practitioner

## 2020-09-12 VITALS — BP 111/75 | HR 80 | Temp 98.1°F | Ht 71.0 in | Wt 299.0 lb

## 2020-09-12 DIAGNOSIS — F32A Depression, unspecified: Secondary | ICD-10-CM

## 2020-09-12 DIAGNOSIS — D500 Iron deficiency anemia secondary to blood loss (chronic): Secondary | ICD-10-CM

## 2020-09-12 DIAGNOSIS — Z1159 Encounter for screening for other viral diseases: Secondary | ICD-10-CM

## 2020-09-12 DIAGNOSIS — R7303 Prediabetes: Secondary | ICD-10-CM

## 2020-09-12 DIAGNOSIS — F419 Anxiety disorder, unspecified: Secondary | ICD-10-CM

## 2020-09-12 DIAGNOSIS — Z114 Encounter for screening for human immunodeficiency virus [HIV]: Secondary | ICD-10-CM

## 2020-09-12 NOTE — Progress Notes (Signed)
Follow up weight loss  Weight 323lbs in 03/2020

## 2020-09-12 NOTE — Progress Notes (Signed)
Assessment & Plan:  Shirley Briggs was seen today for depression.  Diagnoses and all orders for this visit:  Anxiety and depression -     Ambulatory referral to Psychiatry  Prediabetes -     Hemoglobin A1c  Iron deficiency anemia due to chronic blood loss -     CBC  Encounter for screening for HIV -     HIV antibody (with reflex)  Need for hepatitis C screening test -     HCV Ab w Reflex to Quant PCR   Patient has been counseled on age-appropriate routine health concerns for screening and prevention. These are reviewed and up-to-date. Referrals have been placed accordingly. Immunizations are up-to-date or declined.    Subjective:   Chief Complaint  Patient presents with   Depression    HPI Shirley Briggs 25 y.o. female presents to office today for anxiety and depression. States her grandmother who was her main caregiver most of her childhood recently passed. She has had a difficult time dealing with grief. Had been doing well with exercising daily but has lacked the motivation recently to work out.  Weight is currently down 16lbs. She declines medication but is agreeable to counseling. Her PHQ9 has been elevated for a few years.  Depression screen [facility] 2/9 09/12/2020 08/12/2019 07/30/2018 05/08/2018 02/06/2018  Decreased Interest 2 0 0 0 0  Down, Depressed, Hopeless 3 3 2  0 2  PHQ - 2 Score 5 3 2  0 2  Altered sleeping 3 3 3 3 3   Tired, decreased energy 2 3 - 3 2  Change in appetite 3 3 2 1 3   Feeling bad or failure about yourself  0 1 0 0 0  Trouble concentrating 2 3 3  0 3  Moving slowly or fidgety/restless 3 3 2  0 3  Suicidal thoughts 2 0 0 0 0  PHQ-9 Score 20 19 12 7 16   Difficult doing work/chores - - - Not difficult at all -     Prediabetes Well controlled with diet and exercise only at this ttime.  Lab Results  Component Value Date   HGBA1C 5.8 (H) 05/26/2020    Review of Systems  Constitutional:  Negative for fever, malaise/fatigue and weight loss.  HENT:  Negative.  Negative for nosebleeds.   Eyes: Negative.  Negative for blurred vision, double vision and photophobia.  Respiratory: Negative.  Negative for cough and shortness of breath.   Cardiovascular: Negative.  Negative for chest pain, palpitations and leg swelling.  Gastrointestinal: Negative.  Negative for heartburn, nausea and vomiting.  Musculoskeletal: Negative.  Negative for myalgias.  Neurological: Negative.  Negative for dizziness, focal weakness, seizures and headaches.  Psychiatric/Behavioral:  Positive for depression. Negative for suicidal ideas. The patient is nervous/anxious.    Past Medical History:  Diagnosis Date   Iron deficiency anemia due to chronic blood loss     Past Surgical History:  Procedure Laterality Date   CLEFT LIP REPAIR      Family History  Problem Relation Age of Onset   Anemia Mother    Anemia Maternal Grandmother    Heart disease Maternal Grandmother    Sickle cell anemia Neg Hx    Sickle cell trait Neg Hx     Social History Reviewed with no changes to be made today.   Outpatient Medications Prior to Visit  Medication Sig Dispense Refill   ferrous sulfate 325 (65 FE) MG tablet Take 1 tablet (325 mg total) by mouth daily. 90 tablet 1   medroxyPROGESTERone (  PROVERA) 10 MG tablet Take 1 tablet (10 mg total) by mouth daily. Use for ten days (Patient not taking: Reported on 09/12/2020) 10 tablet 12   No facility-administered medications prior to visit.    No Known Allergies     Objective:    BP 111/75   Pulse 80   Temp 98.1 F (36.7 C)   Ht 5\' 11"  (1.803 m)   Wt 299 lb (135.6 kg)   LMP 08/28/2020   SpO2 97%   BMI 41.70 kg/m  Wt Readings from Last 3 Encounters:  09/12/20 299 lb (135.6 kg)  04/12/20 (!) 323 lb (146.5 kg)  08/12/19 (!) 317 lb 11.2 oz (144.1 kg)    Physical Exam Vitals and nursing note reviewed.  Constitutional:      Appearance: She is well-developed.  HENT:     Head: Normocephalic and atraumatic.   Cardiovascular:     Rate and Rhythm: Normal rate and regular rhythm.     Heart sounds: Normal heart sounds. No murmur heard.   No friction rub. No gallop.  Pulmonary:     Effort: Pulmonary effort is normal. No tachypnea or respiratory distress.     Breath sounds: Normal breath sounds. No decreased breath sounds, wheezing, rhonchi or rales.  Chest:     Chest wall: No tenderness.  Abdominal:     General: Bowel sounds are normal.     Palpations: Abdomen is soft.  Musculoskeletal:        General: Normal range of motion.     Cervical back: Normal range of motion.  Skin:    General: Skin is warm and dry.  Neurological:     Mental Status: She is alert and oriented to person, place, and time.     Coordination: Coordination normal.  Psychiatric:        Behavior: Behavior normal. Behavior is cooperative.        Thought Content: Thought content normal.        Judgment: Judgment normal.         Patient has been counseled extensively about nutrition and exercise as well as the importance of adherence with medications and regular follow-up. The patient was given clear instructions to go to ER or return to medical center if symptoms don't improve, worsen or new problems develop. The patient verbalized understanding.   Follow-up: Return for PAP SMEAR.   08/14/19, FNP-BC [facility] [place], [place]   09/12/2020, 4:17 PM

## 2020-09-13 LAB — CBC
Hematocrit: 38.5 % (ref 34.0–46.6)
Hemoglobin: 11.4 g/dL (ref 11.1–15.9)
MCH: 22 pg — ABNORMAL LOW (ref 26.6–33.0)
MCHC: 29.6 g/dL — ABNORMAL LOW (ref 31.5–35.7)
MCV: 74 fL — ABNORMAL LOW (ref 79–97)
Platelets: 361 10*3/uL (ref 150–450)
RBC: 5.19 x10E6/uL (ref 3.77–5.28)
RDW: 16.7 % — ABNORMAL HIGH (ref 11.7–15.4)
WBC: 10.2 10*3/uL (ref 3.4–10.8)

## 2020-09-13 LAB — HEMOGLOBIN A1C
Est. average glucose Bld gHb Est-mCnc: 123 mg/dL
Hgb A1c MFr Bld: 5.9 % — ABNORMAL HIGH (ref 4.8–5.6)

## 2020-09-13 LAB — HCV AB W REFLEX TO QUANT PCR: HCV Ab: <0.1 s/co ratio (ref 0.0–0.9)

## 2020-09-13 LAB — HIV ANTIBODY (ROUTINE TESTING W REFLEX): HIV Screen 4th Generation wRfx: NONREACTIVE

## 2020-09-13 LAB — HCV INTERPRETATION

## 2020-10-11 ENCOUNTER — Ambulatory Visit: Payer: 59 | Admitting: Nurse Practitioner

## 2020-10-25 ENCOUNTER — Other Ambulatory Visit: Payer: Self-pay | Admitting: Nurse Practitioner

## 2020-10-25 ENCOUNTER — Encounter: Payer: Self-pay | Admitting: Nurse Practitioner

## 2020-10-25 DIAGNOSIS — F32A Depression, unspecified: Secondary | ICD-10-CM

## 2020-10-26 ENCOUNTER — Telehealth: Payer: Self-pay | Admitting: *Deleted

## 2020-10-26 NOTE — Telephone Encounter (Signed)
Copied from CRM [id number]. Topic: Referral - Request for Referral >> Oct 25, 2020  2:17 PM Aretta Nip wrote: Has patient seen PCP for this complaint? Yes.   *If NO, is insurance requiring patient see PCP for this issue before PCP can refer them? Referral for which specialty: Anxiety/Depression Preferred provider/office: [facility] Reason for referral: Anxiety Pt has had this referral request since end of August and states no one has gotten back with her and ask that we put an urgent status on it as it has been long enough and is needed, FU with pt, @ [phone] Leave a message with a call back # so she can call right back

## 2020-10-27 NOTE — Telephone Encounter (Signed)
She has been referred to [facility]. Please refer to referral that was just placed within the past 48 hours.

## 2020-10-28 NOTE — Telephone Encounter (Signed)
Called the Pt and Vm was full not able to leave a message.

## 2021-03-19 NOTE — Progress Notes (Signed)
Established Patient Office Visit  Subjective:  Patient ID: Shirley Briggs, female    DOB: 04/01/1995  Age: 26 y.o. MRN: [id number]  CC:  Chief Complaint  Patient presents with   Prediabetes    HPI Shirley Briggs presents for primary care follow-up.  Patient is wishing to have repeat labs to assess her prediabetes status.  She had lost some weight and today is down to 302 pounds She was in the 320 pound range.  She does find that she binge eats and is triggered by stress.  She has had a lot of stress recently and that she lost close family members.  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  She does not exercise.  She is not sleeping well.  She does not have close connections.  She does not smoke or drink alcohol or use other substances.  Patient also has a history of low iron levels several years ago detected.  She had excess bleeding with periods.  She states now that with her menstrual cycle she does not have heavy bleeding.  She was on Provera in the past but this had side effects with weight gain.  It did help with menstrual bleeding.  She takes some magnesium supplements over-the-counter.  She is on no other particular medications.  She does have a great deal of stress and anxiety but is not suicidal.  Her GAD-7 scores and PHQ-9 scores are high.  She does have nausea and some reflux symptoms.  Patient does need a Pap smear.  She denies wishing to have any birth control measures.  She does not though wish to have a pregnancy at this time.  She does not have any active partners.   Past Medical History:  Diagnosis Date   Iron deficiency anemia due to chronic blood loss     Past Surgical History:  Procedure Laterality Date   CLEFT LIP REPAIR      Family History  Problem Relation Age of Onset   Anemia Mother    Anemia Maternal Grandmother    Heart disease Maternal Grandmother    Sickle cell anemia Neg Hx    Sickle cell trait Neg Hx     Social History    Socioeconomic History   Marital status: Single    Spouse name: Not on file   Number of children: Not on file   Years of education: Not on file   Highest education level: Not on file  Occupational History   Not on file  Tobacco Use   Smoking status: Never   Smokeless tobacco: Never  Vaping Use   Vaping Use: Never used  Substance and Sexual Activity   Alcohol use: Yes   Drug use: Never   Sexual activity: Never  Other Topics Concern   Not on file  Social History Narrative   Not on file   Social Determinants of Health   Financial Resource Strain: Not on file  Food Insecurity: Not on file  Transportation Needs: Not on file  Physical Activity: Not on file  Stress: Not on file  Social Connections: Not on file  Intimate Partner Violence: Not on file    Outpatient Medications Prior to Visit  Medication Sig Dispense Refill   ferrous sulfate 325 (65 FE) MG tablet Take 1 tablet (325 mg total) by mouth daily. 90 tablet 1   No facility-administered medications prior to visit.    No Known Allergies  ROS Review of Systems  Constitutional:  Positive for appetite change  and fatigue. Negative for chills, diaphoresis and fever.  HENT:  Negative for congestion, hearing loss, nosebleeds, sore throat and tinnitus.   Eyes:  Negative for photophobia and redness.  Respiratory:  Negative for cough, shortness of breath, wheezing and stridor.   Cardiovascular:  Negative for chest pain, palpitations and leg swelling.  Gastrointestinal:  Negative for abdominal pain, blood in stool, constipation, diarrhea, nausea and vomiting.  Endocrine: Negative for polydipsia.  Genitourinary:  Negative for dysuria, flank pain, frequency, hematuria and urgency.  Musculoskeletal:  Negative for back pain, myalgias and neck pain.  Skin:  Negative for rash.  Allergic/Immunologic: Negative for environmental allergies.  Neurological:  Negative for dizziness, tremors, seizures, weakness and headaches.   Hematological:  Does not bruise/bleed easily.  Psychiatric/Behavioral:  Positive for decreased concentration, dysphoric mood and sleep disturbance. Negative for self-injury and suicidal ideas. The patient is nervous/anxious.      Objective:    Physical Exam Vitals reviewed.  Constitutional:      Appearance: Normal appearance. She is well-developed. She is obese. She is not diaphoretic.  HENT:     Head: Normocephalic and atraumatic.     Right Ear: Tympanic membrane normal.     Left Ear: Tympanic membrane normal.     Nose: Nose normal. No nasal deformity, septal deviation, mucosal edema or rhinorrhea.     Right Sinus: No maxillary sinus tenderness or frontal sinus tenderness.     Left Sinus: No maxillary sinus tenderness or frontal sinus tenderness.     Mouth/Throat:     Mouth: Mucous membranes are moist.     Pharynx: Oropharynx is clear. No oropharyngeal exudate.  Eyes:     General: No scleral icterus.    Conjunctiva/sclera: Conjunctivae normal.     Pupils: Pupils are equal, round, and reactive to light.  Neck:     Thyroid: No thyromegaly.     Vascular: No carotid bruit or JVD.     Trachea: Trachea normal. No tracheal tenderness or tracheal deviation.  Cardiovascular:     Rate and Rhythm: Normal rate and regular rhythm.     Chest Wall: PMI is not displaced.     Pulses: Normal pulses. No decreased pulses.     Heart sounds: Normal heart sounds, S1 normal and S2 normal. Heart sounds not distant. No murmur heard. No systolic murmur is present.  No diastolic murmur is present.    No friction rub. No gallop. No S3 or S4 sounds.  Pulmonary:     Effort: No tachypnea, accessory muscle usage or respiratory distress.     Breath sounds: No stridor. No decreased breath sounds, wheezing, rhonchi or rales.  Chest:     Chest wall: No tenderness.  Abdominal:     General: Bowel sounds are normal. There is no distension.     Palpations: Abdomen is soft. Abdomen is not rigid.     Tenderness:  There is no abdominal tenderness. There is no guarding or rebound.  Musculoskeletal:        General: Normal range of motion.     Cervical back: Normal range of motion and neck supple. No edema, erythema or rigidity. No muscular tenderness. Normal range of motion.  Lymphadenopathy:     Head:     Right side of head: No submental or submandibular adenopathy.     Left side of head: No submental or submandibular adenopathy.     Cervical: No cervical adenopathy.  Skin:    General: Skin is warm and dry.  Coloration: Skin is not pale.     Findings: No rash.     Nails: There is no clubbing.  Neurological:     General: No focal deficit present.     Mental Status: She is alert and oriented to person, place, and time.     Sensory: No sensory deficit.  Psychiatric:        Attention and Perception: Attention and perception normal.        Mood and Affect: Mood and affect normal.        Speech: Speech normal.        Behavior: Behavior normal. Behavior is cooperative.        Thought Content: Thought content normal. Thought content does not include homicidal or suicidal ideation. Thought content does not include homicidal or suicidal plan.        Cognition and Memory: Cognition and memory normal.        Judgment: Judgment normal.    BP 122/80    Pulse 76    Wt (!) 302 lb 6.4 oz (137.2 kg)    SpO2 100%    BMI 42.18 kg/m  Wt Readings from Last 3 Encounters:  03/21/21 (!) 302 lb 6.4 oz (137.2 kg)  09/12/20 299 lb (135.6 kg)  04/12/20 (!) 323 lb (146.5 kg)     Health Maintenance Due  Topic Date Due   COVID-19 Vaccine (1) Never done   HPV VACCINES (1 - 2-dose series) Never done   TETANUS/TDAP  Never done   PAP-Cervical Cytology Screening  Never done   PAP SMEAR-Modifier  Never done   INFLUENZA VACCINE  Never done       Topic Date Due   HPV VACCINES (1 - 2-dose series) Never done    Lab Results  Component Value Date   TSH 1.700 07/30/2018   Lab Results  Component Value Date   WBC  10.2 09/12/2020   HGB 11.4 09/12/2020   HCT 38.5 09/12/2020   MCV 74 (L) 09/12/2020   PLT 361 09/12/2020   Lab Results  Component Value Date   NA 138 05/26/2020   K 4.7 05/26/2020   CO2 23 05/26/2020   GLUCOSE 89 05/26/2020   BUN 11 05/26/2020   CREATININE 0.62 05/26/2020   BILITOT 0.7 10/24/2017   ALKPHOS 78 10/24/2017   AST 14 (L) 10/24/2017   ALT 11 10/24/2017   PROT 7.5 10/24/2017   ALBUMIN 3.6 10/24/2017   CALCIUM 8.9 05/26/2020   ANIONGAP 8 08/17/2018   EGFR 127 05/26/2020   Lab Results  Component Value Date   CHOL 147 05/26/2020   Lab Results  Component Value Date   HDL 37 (L) 05/26/2020   Lab Results  Component Value Date   LDLCALC 93 05/26/2020   Lab Results  Component Value Date   TRIG 91 05/26/2020   Lab Results  Component Value Date   CHOLHDL 4.0 05/26/2020   Lab Results  Component Value Date   HGBA1C 5.9 (H) 09/12/2020      Assessment & Plan:   Problem List Items Addressed This Visit       Other   Anemia   Relevant Orders   CBC with Differential/Platelet   Iron, TIBC and Ferritin Panel   Morbid obesity ([place])   Relevant Orders   Comprehensive metabolic panel   Thyroid Panel With TSH   Lipid panel   Other Visit Diagnoses     Cervical cancer screening    -  Primary   Relevant Orders  Ambulatory referral to Gynecology   Prediabetes       Relevant Orders   Comprehensive metabolic panel   Lipid panel   Hemoglobin A1c   Hypomagnesemia       Relevant Orders   Magnesium      38 minutes spent performing history and physical reviewing old records multiple systems assessed high degree of complexity referrals placed patient educated patient education took 10 minutes using lifestyle medicine handouts No orders of the defined types were placed in this encounter.   Follow-up: Return in about 2 months (around 05/21/2021).    Asencion Noble, MD

## 2021-03-21 ENCOUNTER — Telehealth: Payer: Self-pay | Admitting: Critical Care Medicine

## 2021-03-21 ENCOUNTER — Ambulatory Visit: Payer: 59 | Attending: Critical Care Medicine | Admitting: Critical Care Medicine

## 2021-03-21 ENCOUNTER — Other Ambulatory Visit: Payer: Self-pay

## 2021-03-21 ENCOUNTER — Encounter: Payer: Self-pay | Admitting: Critical Care Medicine

## 2021-03-21 VITALS — BP 122/80 | HR 76 | Wt 302.4 lb

## 2021-03-21 DIAGNOSIS — Z6841 Body Mass Index (BMI) 40.0 and over, adult: Secondary | ICD-10-CM

## 2021-03-21 DIAGNOSIS — Z124 Encounter for screening for malignant neoplasm of cervix: Secondary | ICD-10-CM | POA: Insufficient documentation

## 2021-03-21 DIAGNOSIS — R7303 Prediabetes: Secondary | ICD-10-CM | POA: Diagnosis not present

## 2021-03-21 DIAGNOSIS — D500 Iron deficiency anemia secondary to blood loss (chronic): Secondary | ICD-10-CM | POA: Diagnosis not present

## 2021-03-21 NOTE — Assessment & Plan Note (Signed)
History of anemia in the past from excess menstrual bleeding ? ?We will reassess CBC and iron levels ? ?If the patient's low in ferritin this may play a role in restless legs and sleep disturbance she also may need to sleep study ?

## 2021-03-21 NOTE — Patient Instructions (Signed)
A referral to gynecology will be made for pap smear ? ?Complete health screening labs to be obtained ? ?Return to Dr Delford Field 2 months ? ?Follow lifestyle medicine handout guidelines ? ?An appointment with Asante our clinical social worker will be made ? ? ?

## 2021-03-21 NOTE — Assessment & Plan Note (Signed)
Referral made back to gynecology for assessment of menstrual bleeding and Pap smear ?

## 2021-03-21 NOTE — Assessment & Plan Note (Signed)
Plan to reassess A1c ?

## 2021-03-21 NOTE — Assessment & Plan Note (Signed)
History of hypomagnesemia on oral supplements ? ?Reassess magnesium level ?

## 2021-03-21 NOTE — Assessment & Plan Note (Signed)
Morbid obesity with BMI 42 ? ?I went over with the patient a lifestyle medicine healthy diet and exercise plan I indicated she needs to get at least 100 minutes of brisk walking a week.  I emphasized a plant-based diet increase fiber in her diet to improve her constipation. ?

## 2021-03-21 NOTE — Telephone Encounter (Signed)
Please see this patient soon severe anxiety depression  not suicidal   works all the time and then goes home to be by herself.   Recent sudden loss of close relatives ?

## 2021-03-21 NOTE — Telephone Encounter (Signed)
Spoke with pt and scheduled appt for 04/12/21

## 2021-03-22 LAB — IRON,TIBC AND FERRITIN PANEL
Ferritin: 30 ng/mL (ref 15–150)
Iron Saturation: 20 % (ref 15–55)
Iron: 67 ug/dL (ref 27–159)
Total Iron Binding Capacity: 341 ug/dL (ref 250–450)
UIBC: 274 ug/dL (ref 131–425)

## 2021-03-22 LAB — CBC WITH DIFFERENTIAL/PLATELET
Basophils Absolute: 0 10*3/uL (ref 0.0–0.2)
Basos: 0 %
EOS (ABSOLUTE): 0.1 10*3/uL (ref 0.0–0.4)
Eos: 1 %
Hematocrit: 37.6 % (ref 34.0–46.6)
Hemoglobin: 12.1 g/dL (ref 11.1–15.9)
Immature Grans (Abs): 0 10*3/uL (ref 0.0–0.1)
Immature Granulocytes: 0 %
Lymphocytes Absolute: 1.9 10*3/uL (ref 0.7–3.1)
Lymphs: 22 %
MCH: 25.1 pg — ABNORMAL LOW (ref 26.6–33.0)
MCHC: 32.2 g/dL (ref 31.5–35.7)
MCV: 78 fL — ABNORMAL LOW (ref 79–97)
Monocytes Absolute: 0.5 10*3/uL (ref 0.1–0.9)
Monocytes: 5 %
Neutrophils Absolute: 6.2 10*3/uL (ref 1.4–7.0)
Neutrophils: 72 %
Platelets: 289 10*3/uL (ref 150–450)
RBC: 4.82 x10E6/uL (ref 3.77–5.28)
RDW: 15.2 % (ref 11.7–15.4)
WBC: 8.7 10*3/uL (ref 3.4–10.8)

## 2021-03-22 LAB — COMPREHENSIVE METABOLIC PANEL
ALT: 9 IU/L (ref 0–32)
AST: 13 IU/L (ref 0–40)
Albumin/Globulin Ratio: 1.4 (ref 1.2–2.2)
Albumin: 4.3 g/dL (ref 3.9–5.0)
Alkaline Phosphatase: 115 IU/L (ref 44–121)
BUN/Creatinine Ratio: 12 (ref 9–23)
BUN: 8 mg/dL (ref 6–20)
Bilirubin Total: 0.3 mg/dL (ref 0.0–1.2)
CO2: 23 mmol/L (ref 20–29)
Calcium: 9.5 mg/dL (ref 8.7–10.2)
Chloride: 102 mmol/L (ref 96–106)
Creatinine, Ser: 0.67 mg/dL (ref 0.57–1.00)
Globulin, Total: 3 g/dL (ref 1.5–4.5)
Glucose: 87 mg/dL (ref 70–99)
Potassium: 5 mmol/L (ref 3.5–5.2)
Sodium: 142 mmol/L (ref 134–144)
Total Protein: 7.3 g/dL (ref 6.0–8.5)
eGFR: 124 mL/min/{1.73_m2} (ref >59)

## 2021-03-22 LAB — THYROID PANEL WITH TSH
Free Thyroxine Index: 1.8 (ref 1.2–4.9)
T3 Uptake Ratio: 21 % — ABNORMAL LOW (ref 24–39)
T4, Total: 8.8 ug/dL (ref 4.5–12.0)
TSH: 1.11 u[IU]/mL (ref 0.450–4.500)

## 2021-03-22 LAB — LIPID PANEL
Chol/HDL Ratio: 3.8 ratio (ref 0.0–4.4)
Cholesterol, Total: 162 mg/dL (ref 100–199)
HDL: 43 mg/dL (ref >39)
LDL Chol Calc (NIH): 106 mg/dL — ABNORMAL HIGH (ref 0–99)
Triglycerides: 65 mg/dL (ref 0–149)
VLDL Cholesterol Cal: 13 mg/dL (ref 5–40)

## 2021-03-22 LAB — HEMOGLOBIN A1C
Est. average glucose Bld gHb Est-mCnc: 114 mg/dL
Hgb A1c MFr Bld: 5.6 % (ref 4.8–5.6)

## 2021-03-22 LAB — MAGNESIUM: Magnesium: 2.1 mg/dL (ref 1.6–2.3)

## 2021-04-10 ENCOUNTER — Encounter: Payer: Self-pay | Admitting: Critical Care Medicine

## 2021-04-10 DIAGNOSIS — L299 Pruritus, unspecified: Secondary | ICD-10-CM

## 2021-04-10 NOTE — Telephone Encounter (Signed)
Pt needs lab visit appt for allergy testing  ?

## 2021-04-12 ENCOUNTER — Ambulatory Visit: Payer: 59 | Attending: Critical Care Medicine | Admitting: Clinical

## 2021-04-12 ENCOUNTER — Other Ambulatory Visit: Payer: Self-pay

## 2021-04-12 ENCOUNTER — Ambulatory Visit: Payer: 59

## 2021-04-12 DIAGNOSIS — F331 Major depressive disorder, recurrent, moderate: Secondary | ICD-10-CM | POA: Diagnosis not present

## 2021-04-12 DIAGNOSIS — L299 Pruritus, unspecified: Secondary | ICD-10-CM

## 2021-04-14 ENCOUNTER — Encounter (HOSPITAL_BASED_OUTPATIENT_CLINIC_OR_DEPARTMENT_OTHER): Payer: 59 | Admitting: Critical Care Medicine

## 2021-04-14 DIAGNOSIS — J301 Allergic rhinitis due to pollen: Secondary | ICD-10-CM

## 2021-04-14 DIAGNOSIS — J3081 Allergic rhinitis due to animal (cat) (dog) hair and dander: Secondary | ICD-10-CM

## 2021-04-14 LAB — ALLERGENS W/TOTAL IGE AREA 2
Alternaria Alternata IgE: 5.4 kU/L — AB
Aspergillus Fumigatus IgE: 0.18 kU/L — AB
Bermuda Grass IgE: 0.12 kU/L — AB
Cat Dander IgE: 12.3 kU/L — AB
Cedar, Mountain IgE: 0.12 kU/L — AB
Cladosporium Herbarum IgE: 0.14 kU/L — AB
Cockroach, German IgE: 0.45 kU/L — AB
Common Silver Birch IgE: 0.52 kU/L — AB
Cottonwood IgE: <0.1 kU/L
D Farinae IgE: 0.24 kU/L — AB
D Pteronyssinus IgE: 0.22 kU/L — AB
Dog Dander IgE: 1.87 kU/L — AB
Elm, American IgE: <0.1 kU/L
IgE (Immunoglobulin E), Serum: 324 IU/mL (ref 6–495)
Johnson Grass IgE: 0.15 kU/L — AB
Maple/Box Elder IgE: <0.1 kU/L
Mouse Urine IgE: <0.1 kU/L
Oak, White IgE: 0.93 kU/L — AB
Pecan, Hickory IgE: 0.41 kU/L — AB
Penicillium Chrysogen IgE: 0.14 kU/L — AB
Pigweed, Rough IgE: <0.1 kU/L
Ragweed, Short IgE: <0.1 kU/L
Sheep Sorrel IgE Qn: <0.1 kU/L
Timothy Grass IgE: 0.26 kU/L — AB
White Mulberry IgE: <0.1 kU/L

## 2021-04-14 LAB — ALLERGEN PROFILE, MOLD
Aureobasidi Pullulans IgE: <0.1 kU/L
Candida Albicans IgE: 0.17 kU/L — AB
M009-IgE Fusarium proliferatum: <0.1 kU/L
M014-IgE Epicoccum purpur: 1.13 kU/L — AB
Mucor Racemosus IgE: <0.1 kU/L
Phoma Betae IgE: 0.49 kU/L — AB
Setomelanomma Rostrat: 0.48 kU/L — AB
Stemphylium Herbarum IgE: 0.8 kU/L — AB

## 2021-04-19 ENCOUNTER — Other Ambulatory Visit: Payer: Self-pay

## 2021-04-19 MED ORDER — HYDROXYZINE HCL 25 MG PO TABS
25.0000 mg | ORAL_TABLET | Freq: Three times a day (TID) | ORAL | 0 refills | Status: DC | PRN
  Filled 2021-04-19: qty 60, 20d supply, fill #0

## 2021-04-19 NOTE — Telephone Encounter (Signed)
Patient with complains of generalized pruritus after going outside, symptoms persist when she returns indoors. ?Allergy test performed by PCP revealed allergies to cat and dog dander, grasses, roaches, Candida.  We will place on hydroxyzine. ?

## 2021-04-20 NOTE — BH Specialist Note (Signed)
Integrated Behavioral Health Initial In-Person Visit ? ?MRN: [id number] ?Name: Shirley Briggs ? ?Number of Integrated Behavioral Health Clinician visits: 1- Initial Visit ? ?Session Start time: 1610 ?   ?Session End time: 1710 ? ?Total time in minutes: 60 ? ? ?Types of Service: Individual psychotherapy ? ?Interpretor:No. Interpretor Name and Language: N/A ? ? Warm Hand Off Completed. ?  ? ?  ? ? ?Subjective: ?Shirley Briggs is a 26 y.o. female accompanied by  self ?Patient was referred by Shan Levans, MD for anxiety and depression. ?Patient reports the following symptoms/concerns: Reports feeling depressed, decreased energy, trouble concentrating, fidgeting, excessive worrying, irritability, and anxiousness. Reports trouble sleeping at times. Reports that her grandmother and cousin passed away. Reports that her grandmother raised her and she currently lives in her home. Reports that she still has her grandmother's items. Reports that she also experiences stress at her current job.  ?Duration of problem: 1 year; Severity of problem: moderate ? ?Objective: ?Mood: Anxious and Depressed and Affect: Appropriate ?Risk of harm to self or others: No plan to harm self or others ? ?Life Context: ?Family and Social: Pt receives support from family.  ?School/Work: Pt is employed full time.  ?Self-Care: Pt denies substance use.  ?Life Changes: Pt's grandmother and cousin passed away.  ? ?Patient and/or Family's Strengths/Protective Factors: ?Social and Emotional competence ? ?Goals Addressed: ?Patient will: ?Reduce symptoms of: anxiety and depression ?Increase knowledge and/or ability of: coping skills  ?Demonstrate ability to: Increase healthy adjustment to current life circumstances and Begin healthy grieving over loss ? ?Progress towards Goals: ?Ongoing ? ?Interventions: ?Interventions utilized: Copywriter, advertising, CBT Cognitive Behavioral Therapy, Supportive Counseling, and Psychoeducation  and/or Health Education  ?Standardized Assessments completed: GAD-7 and PHQ 9 ? ?  04/12/2021  ?  4:17 PM 09/12/2020  ?  4:08 PM 08/12/2019  ?  4:54 PM 07/30/2018  ?  3:25 PM  ?GAD 7 : Generalized Anxiety Score  ?Nervous, Anxious, on Edge 3 2 2  0  ?Control/stop worrying 3 2 2 1   ?Worry too much - different things 3 3 2 2   ?Trouble relaxing 0 0 0 0  ?Restless 0 0 1 0  ?Easily annoyed or irritable 3 3 3 3   ?Afraid - awful might happen 2 3 1  0  ?Total GAD 7 Score 14 13 11 6   ? ?  ? ?  04/12/2021  ?  4:12 PM 09/12/2020  ?  4:02 PM 08/12/2019  ?  4:54 PM 07/30/2018  ?  3:25 PM 05/08/2018  ?  2:31 PM  ?Depression screen PHQ 2/9  ?Decreased Interest 3 2 0 0 0  ?Down, Depressed, Hopeless 3 3 3 2  0  ?PHQ - 2 Score 6 5 3 2  0  ?Altered sleeping 3 3 3 3 3   ?Tired, decreased energy 3 2 3  3   ?Change in appetite 1 3 3 2 1   ?Feeling bad or failure about yourself  0 0 1 0 0  ?Trouble concentrating 3 2 3 3  0  ?Moving slowly or fidgety/restless 3 3 3 2  0  ?Suicidal thoughts 0 2 0 0 0  ?PHQ-9 Score 19 20 19 12 7   ?Difficult doing work/chores     Not difficult at all  ?  ?Patient and/or Family Response: Pt receptive to tx. Pt receptive to psychoeducation provided on grief, depression, and anxiety. Pt receptive to cognitive restructuring to decrease negative thoughts. Pt receptive to incorporating self-care and considering grief journaling.  ? ?Patient Centered Plan: ?  Patient is on the following Treatment Plan(s):  Grief ? ?Assessment: ?Denies SI/HI. Denies auditory/visual hallucinations. Patient currently experiencing grief related to the death of her grandmother and cousin. Pt is having difficulty accepting the loss of grandmother and cousin. Pt also appears to be overwhelmed with work. ?  ?Patient may benefit from outpatient therapy. LCSW will provide pt with lists of therapists who accept pt's insurance. LCSW provided psychoeducation on grief, depression and anxiety. LCSW utilized cognitive restructuring to decrease negative thoughts. LCSW  encouraged pt to incorporate daily self-care and consider grief journaling. LCSW also encouraged pt to adjust night routine to improve quality of sleep. LCSW will fu with pt. ? ?Plan: ?Follow up with behavioral health clinician on : 05/03/21 ?Behavioral recommendations: Utilize daily self-care and grief journaling. Adjust night routine to improve quality of sleep. ?Referral(s): Integrated [employer] (In Clinic) and Counselor ?"From scale of 1-10, how likely are you to follow plan?": 10 ? ?Divante Kotch C Corley Kohls, LCSW ? ? ? ? ? ? ? ? ?

## 2021-04-24 ENCOUNTER — Other Ambulatory Visit: Payer: Self-pay

## 2021-05-01 ENCOUNTER — Ambulatory Visit: Payer: Medicaid Other | Attending: Critical Care Medicine | Admitting: Clinical

## 2021-05-01 DIAGNOSIS — F331 Major depressive disorder, recurrent, moderate: Secondary | ICD-10-CM

## 2021-05-01 NOTE — BH Specialist Note (Signed)
Integrated Behavioral Health Follow Up In-Person Visit ? ?MRN: [id number] ?Name: Shirley Briggs ? ?Number of [place] Clinician visits: 2- Second Visit ? ?Session Start time: [id number] ?  ?Session End time: [id number] ? ?Total time in minutes: 60 ? ? ?Types of Service: Individual psychotherapy ? ?Interpretor:No. Interpretor Name and Language: N/A ? ?Subjective: ?[name] is a 26 y.o. female accompanied by  self ?Patient was referred by Asencion Noble, MD for anxiety and depression. ?Patient reports the following symptoms/concerns: Reports that she has improved her sleep by decreasing time spent on her phone. Reports at times feeling down due to missing her grandmother and arguing with her mother. Reports a hx of trauma in her relationship with her mother. Reports her mother is currently Briggs with her and she has a hx of substance use and she currently uses alcohol. Further reports that she is searching for new employment. ?Duration of problem: 1 year; Severity of problem: moderate ? ?Objective: ?Mood: Anxious and Depressed and Affect: Appropriate ?Risk of harm to self or others: No plan to harm self or others ? ?Life Context: ?Family and Social: Pt receives support from family.  ?School/Work: Pt is employed full time.  ?Self-Care: Pt denies substance use.  ?Life Changes: Pt's grandmother and cousin passed away.  ?(No changes to life context) ? ?Patient and/or Family's Strengths/Protective Factors: ?Social and Emotional competence ? ?Goals Addressed: ?Patient will: ? Reduce symptoms of: anxiety and depression  ? Increase knowledge and/or ability of: coping skills  ? Demonstrate ability to: Increase healthy adjustment to current life circumstances and Begin healthy grieving over loss ? ?Progress towards Goals: ?Ongoing ? ?Interventions: ?Interventions utilized:  Optician, dispensing, CBT Cognitive Behavioral Therapy, and Supportive Counseling ?Standardized Assessments  completed: Not Needed ? ?Patient and/or Family Response: Pt receptive to tx. Pt receptive to psychoeducation provided on depression and trauma. Pt receptive to establishing boundaries with mother.  ? ?Patient Centered Plan: ?Patient is on the following Treatment Plan(s): Grief ? ?Assessment: ?Denies SI/HI. Patient currently experiencing depression and anxiety associated with grief and hx of trauma. Pt appears to have a hx of trauma in her relationship with her mother. Pt's relationship with her mother contributes to her grief. Pt has been adjusting her night routine to improve her sleep. ? ?Patient may benefit from establishing boundaries with her mother. LCSW provided psychoeducation on depression and trauma. LCSW utilized cognitive restructuring and encouraged pt to establish boundaries with her mother. LCSW encouraged pt to continue adjusting night routine. LCSW made pt aware of her departure from [facility] in previous session. LCSW provided pt with counseling resources whom accept pt's insurance. LCSW encouraged pt to utilize these resources. ? ?Plan: ?Follow up with behavioral health clinician on : Utilize resources and schedule a counseling appt. ?Behavioral recommendations: Establish boundaries with mother and continue adjusting night routine. ?Referral(s): Counselor ?"From scale of 1-10, how likely are you to follow plan?": 10 ? ?Cynitha Berte C Rori Goar, LCSW ? ? ?

## 2021-05-29 ENCOUNTER — Ambulatory Visit: Payer: 59 | Admitting: Critical Care Medicine

## 2021-06-05 ENCOUNTER — Other Ambulatory Visit: Payer: Self-pay | Admitting: Family Medicine

## 2021-06-06 ENCOUNTER — Other Ambulatory Visit: Payer: Self-pay

## 2021-06-06 ENCOUNTER — Other Ambulatory Visit (HOSPITAL_COMMUNITY): Payer: Self-pay

## 2021-06-06 MED ORDER — HYDROXYZINE HCL 25 MG PO TABS
25.0000 mg | ORAL_TABLET | Freq: Three times a day (TID) | ORAL | 0 refills | Status: DC | PRN
  Filled 2021-06-06: qty 60, 20d supply, fill #0

## 2021-06-06 NOTE — Telephone Encounter (Signed)
Requested Prescriptions  Pending Prescriptions Disp Refills  . hydrOXYzine (ATARAX) 25 MG tablet 60 tablet 0    Sig: Take 1 tablet (25 mg total) by mouth 3 (three) times daily as needed for itching.     Ear, Nose, and Throat:  Antihistamines 2 Passed - 06/05/2021  9:31 AM      Passed - Cr in normal range and within 360 days    Creatinine, Ser  Date Value Ref Range Status  03/21/2021 0.67 0.57 - 1.00 mg/dL Final         Passed - Valid encounter within last 12 months    Recent Outpatient Visits          2 months ago Prediabetes   [facility] Storm Frisk, MD   8 months ago Anxiety and depression   [facility] [place], Shea Stakes, NP   1 year ago Encounter to establish care   [facility] [place], [place] W, NP   1 year ago Iron deficiency anemia due to chronic blood loss   [facility] [place], [place], [place]   2 years ago Iron deficiency anemia due to chronic blood loss   [facility] Cain Saupe, MD      Future Appointments            In 1 week Delford Field Charlcie Cradle, MD [facility]

## 2021-06-14 ENCOUNTER — Ambulatory Visit: Payer: 59 | Admitting: Critical Care Medicine

## 2021-07-03 ENCOUNTER — Other Ambulatory Visit: Payer: Self-pay | Admitting: Family Medicine

## 2021-07-04 ENCOUNTER — Other Ambulatory Visit (HOSPITAL_COMMUNITY): Payer: Self-pay

## 2021-07-04 MED ORDER — HYDROXYZINE HCL 25 MG PO TABS
25.0000 mg | ORAL_TABLET | Freq: Three times a day (TID) | ORAL | 0 refills | Status: DC | PRN
  Filled 2021-07-04: qty 60, 20d supply, fill #0

## 2021-07-04 NOTE — Telephone Encounter (Signed)
Requested Prescriptions  Pending Prescriptions Disp Refills  . hydrOXYzine (ATARAX) 25 MG tablet 60 tablet 0    Sig: Take 1 tablet (25 mg total) by mouth 3 (three) times daily as needed for itching.     Ear, Nose, and Throat:  Antihistamines 2 Passed - 07/03/2021  7:53 PM      Passed - Cr in normal range and within 360 days    Creatinine, Ser  Date Value Ref Range Status  03/21/2021 0.67 0.57 - 1.00 mg/dL Final         Passed - Valid encounter within last 12 months    Recent Outpatient Visits          3 months ago Prediabetes   [facility] Storm Frisk, MD   9 months ago Anxiety and depression   [facility] [place], Shea Stakes, NP   1 year ago Encounter to establish care   [facility] [place], [place] W, NP   1 year ago Iron deficiency anemia due to chronic blood loss   [facility] [place], [place], [place]   2 years ago Iron deficiency anemia due to chronic blood loss   [facility] Cain Saupe, MD      Future Appointments            In 1 week Delford Field Charlcie Cradle, MD [facility]

## 2021-07-07 ENCOUNTER — Other Ambulatory Visit (HOSPITAL_COMMUNITY): Payer: Self-pay

## 2021-07-10 ENCOUNTER — Other Ambulatory Visit (HOSPITAL_COMMUNITY): Payer: Self-pay

## 2021-07-11 ENCOUNTER — Other Ambulatory Visit: Payer: Self-pay

## 2021-07-11 ENCOUNTER — Encounter: Payer: Self-pay | Admitting: Critical Care Medicine

## 2021-07-11 ENCOUNTER — Ambulatory Visit: Payer: Medicaid Other | Attending: Critical Care Medicine | Admitting: Critical Care Medicine

## 2021-07-11 VITALS — BP 128/83 | HR 92 | Wt 318.2 lb

## 2021-07-11 DIAGNOSIS — Z124 Encounter for screening for malignant neoplasm of cervix: Secondary | ICD-10-CM

## 2021-07-11 DIAGNOSIS — Z6841 Body Mass Index (BMI) 40.0 and over, adult: Secondary | ICD-10-CM

## 2021-07-11 DIAGNOSIS — L509 Urticaria, unspecified: Secondary | ICD-10-CM

## 2021-07-11 DIAGNOSIS — D500 Iron deficiency anemia secondary to blood loss (chronic): Secondary | ICD-10-CM

## 2021-07-11 MED ORDER — CETIRIZINE HCL 10 MG PO TABS
10.0000 mg | ORAL_TABLET | Freq: Every day | ORAL | 11 refills | Status: DC
  Filled 2021-07-11 – 2021-09-08 (×4): qty 30, 30d supply, fill #0
  Filled 2021-10-20: qty 30, 30d supply, fill #1

## 2021-07-11 MED ORDER — FAMOTIDINE 20 MG PO TABS
20.0000 mg | ORAL_TABLET | Freq: Every day | ORAL | 6 refills | Status: DC
  Filled 2021-07-11: qty 30, 30d supply, fill #0
  Filled 2021-07-11: qty 60, 60d supply, fill #0
  Filled 2021-08-31: qty 30, 30d supply, fill #0
  Filled 2021-09-08: qty 60, 60d supply, fill #0
  Filled 2021-10-20: qty 60, 60d supply, fill #1

## 2021-07-11 NOTE — Assessment & Plan Note (Signed)
Recurrent urticaria throughout the entire body she has very high levels of various environmental allergens Plan is to begin Pepcid 20 mg daily on a scheduled basis and cetirizine 10 mg daily  Patient will use hydroxyzine as needed  Patient to be referred to allergy and asthma

## 2021-07-11 NOTE — Assessment & Plan Note (Signed)
Anemia has resolved.  

## 2021-07-11 NOTE — Progress Notes (Signed)
Established Patient Office Visit  Subjective:  Patient ID: Shirley Briggs, female    DOB: [birth year]  Age: 26 y.o. MRN: [id number]  CC:  Urticaria  HPI 03/2021 Shirley Briggs presents for primary care follow-up.  Patient is wishing to have repeat labs to assess her prediabetes status.  She had lost some weight and today is down to 302 pounds She was in the 320 pound range.  She does find that she binge eats and is triggered by stress.  She has had a lot of stress recently and that she lost close family members.  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  She does not exercise.  She is not sleeping well.  She does not have close connections.  She does not smoke or drink alcohol or use other substances.  Patient also has a history of low iron levels several years ago detected.  She had excess bleeding with periods.  She states now that with her menstrual cycle she does not have heavy bleeding.  She was on Provera in the past but this had side effects with weight gain.  It did help with menstrual bleeding.  She takes some magnesium supplements over-the-counter.  She is on no other particular medications.  She does have a great deal of stress and anxiety but is not suicidal.  Her GAD-7 scores and PHQ-9 scores are high.  She does have nausea and some reflux symptoms.  Patient does need a Pap smear.  She denies wishing to have any birth control measures.  She does not though wish to have a pregnancy at this time.  She does not have any active partners.  6/27 This patient seen in return follow-up complains of bilateral urticaria noted at the last visit she had multiple environmental allergies.  She is denies any change in any hygiene she does use Tide detergent to clean her close.  This began in March and has persisted.  Hydroxyzine helps to some degree.  She also gets this condition when she becomes anxious.  She has had mental health counseling and will need continued mental health  care.  Patient does need a Pap smear will need a gynecology referral [facility]  On arrival blood pressure 128/83.  There are no other complaints.  At the last visit iron levels were and blood counts were normal.  Thyroid was normal lipid panel was at goal  Past Medical History:  Diagnosis Date   Anemia 11/06/2017   Iron deficiency anemia due to chronic blood loss     Past Surgical History:  Procedure Laterality Date   CLEFT LIP REPAIR      Family History  Problem Relation Age of Onset   Anemia Mother    Anemia Maternal Grandmother    Heart disease Maternal Grandmother    Sickle cell anemia Neg Hx    Sickle cell trait Neg Hx     Social History   Socioeconomic History   Marital status: Single    Spouse name: Not on file   Number of children: Not on file   Years of education: Not on file   Highest education level: Not on file  Occupational History   Not on file  Tobacco Use   Smoking status: Never   Smokeless tobacco: Never  Vaping Use   Vaping Use: Never used  Substance and Sexual Activity   Alcohol use: Yes   Drug use: Never   Sexual activity: Never  Other  Topics Concern   Not on file  Social History Narrative   Not on file   Social Determinants of Health   Financial Resource Strain: Not on file  Food Insecurity: No Food Insecurity (08/12/2019)   Hunger Vital Sign    Worried About Running Out of Food in the Last Year: Never true    Ran Out of Food in the Last Year: Never true  Transportation Needs: Unmet Transportation Needs (08/12/2019)   PRAPARE - Administrator, Civil Service (Medical): Yes    Lack of Transportation (Non-Medical): Yes  Physical Activity: Not on file  Stress: Not on file  Social Connections: Not on file  Intimate Partner Violence: Not on file    Outpatient Medications Prior to Visit  Medication Sig Dispense Refill   ferrous sulfate 325 (65 FE) MG tablet Take 1 tablet (325 mg total) by mouth  daily. 90 tablet 1   hydrOXYzine (ATARAX) 25 MG tablet Take 1 tablet (25 mg total) by mouth 3 (three) times daily as needed for itching. 60 tablet 0   No facility-administered medications prior to visit.    No Known Allergies  ROS Review of Systems  Constitutional:  Positive for appetite change and fatigue. Negative for chills, diaphoresis and fever.  HENT:  Negative for congestion, hearing loss, nosebleeds, sore throat and tinnitus.   Eyes:  Negative for photophobia and redness.  Respiratory:  Negative for cough, shortness of breath, wheezing and stridor.   Cardiovascular:  Negative for chest pain, palpitations and leg swelling.  Gastrointestinal:  Negative for abdominal pain, blood in stool, constipation, diarrhea, nausea and vomiting.  Endocrine: Negative for polydipsia.  Genitourinary:  Negative for dysuria, flank pain, frequency, hematuria and urgency.  Musculoskeletal:  Negative for back pain, myalgias and neck pain.  Skin:  Negative for rash.  Allergic/Immunologic: Negative for environmental allergies.  Neurological:  Negative for dizziness, tremors, seizures, weakness and headaches.  Hematological:  Does not bruise/bleed easily.  Psychiatric/Behavioral:  Positive for decreased concentration, dysphoric mood and sleep disturbance. Negative for self-injury and suicidal ideas. The patient is nervous/anxious.       Objective:    Physical Exam Vitals reviewed.  Constitutional:      Appearance: Normal appearance. She is well-developed. She is obese. She is not diaphoretic.  HENT:     Head: Normocephalic and atraumatic.     Right Ear: Tympanic membrane normal.     Left Ear: Tympanic membrane normal.     Nose: Nose normal. No nasal deformity, septal deviation, mucosal edema or rhinorrhea.     Right Sinus: No maxillary sinus tenderness or frontal sinus tenderness.     Left Sinus: No maxillary sinus tenderness or frontal sinus tenderness.     Mouth/Throat:     Mouth: Mucous  membranes are moist.     Pharynx: Oropharynx is clear. No oropharyngeal exudate.  Eyes:     General: No scleral icterus.    Conjunctiva/sclera: Conjunctivae normal.     Pupils: Pupils are equal, round, and reactive to light.  Neck:     Thyroid: No thyromegaly.     Vascular: No carotid bruit or JVD.     Trachea: Trachea normal. No tracheal tenderness or tracheal deviation.  Cardiovascular:     Rate and Rhythm: Normal rate and regular rhythm.     Chest Wall: PMI is not displaced.     Pulses: Normal pulses. No decreased pulses.     Heart sounds: Normal heart sounds, S1 normal and S2 normal. Heart  sounds not distant. No murmur heard.    No systolic murmur is present.     No diastolic murmur is present.     No friction rub. No gallop. No S3 or S4 sounds.  Pulmonary:     Effort: No tachypnea, accessory muscle usage or respiratory distress.     Breath sounds: No stridor. No decreased breath sounds, wheezing, rhonchi or rales.  Chest:     Chest wall: No tenderness.  Abdominal:     General: Bowel sounds are normal. There is no distension.     Palpations: Abdomen is soft. Abdomen is not rigid.     Tenderness: There is no abdominal tenderness. There is no guarding or rebound.  Musculoskeletal:        General: Normal range of motion.     Cervical back: Normal range of motion and neck supple. No edema, erythema or rigidity. No muscular tenderness. Normal range of motion.  Lymphadenopathy:     Head:     Right side of head: No submental or submandibular adenopathy.     Left side of head: No submental or submandibular adenopathy.     Cervical: No cervical adenopathy.  Skin:    General: Skin is warm and dry.     Coloration: Skin is not pale.     Findings: No rash.     Nails: There is no clubbing.  Neurological:     General: No focal deficit present.     Mental Status: She is alert and oriented to person, place, and time.     Sensory: No sensory deficit.  Psychiatric:        Attention and  Perception: Attention and perception normal.        Mood and Affect: Mood and affect normal.        Speech: Speech normal.        Behavior: Behavior normal. Behavior is cooperative.        Thought Content: Thought content normal. Thought content does not include homicidal or suicidal ideation. Thought content does not include homicidal or suicidal plan.        Cognition and Memory: Cognition and memory normal.        Judgment: Judgment normal.     BP 128/83   Pulse 92   Wt (!) 318 lb 3.2 oz (144.3 kg)   SpO2 96%   BMI 44.38 kg/m  Wt Readings from Last 3 Encounters:  07/11/21 (!) 318 lb 3.2 oz (144.3 kg)  03/21/21 (!) 302 lb 6.4 oz (137.2 kg)  09/12/20 299 lb (135.6 kg)     Health Maintenance Due  Topic Date Due   HPV VACCINES (1 - 2-dose series) Never done   TETANUS/TDAP  Never done   PAP-Cervical Cytology Screening  Never done   PAP SMEAR-Modifier  Never done       Topic Date Due   HPV VACCINES (1 - 2-dose series) Never done    Lab Results  Component Value Date   TSH 1.110 03/21/2021   Lab Results  Component Value Date   WBC 8.7 03/21/2021   HGB 12.1 03/21/2021   HCT 37.6 03/21/2021   MCV 78 (L) 03/21/2021   PLT 289 03/21/2021   Lab Results  Component Value Date   NA 142 03/21/2021   K 5.0 03/21/2021   CO2 23 03/21/2021   GLUCOSE 87 03/21/2021   BUN 8 03/21/2021   CREATININE 0.67 03/21/2021   BILITOT 0.3 03/21/2021   ALKPHOS 115 03/21/2021   AST  13 03/21/2021   ALT 9 03/21/2021   PROT 7.3 03/21/2021   ALBUMIN 4.3 03/21/2021   CALCIUM 9.5 03/21/2021   ANIONGAP 8 08/17/2018   EGFR 124 03/21/2021   Lab Results  Component Value Date   CHOL 162 03/21/2021   Lab Results  Component Value Date   HDL 43 03/21/2021   Lab Results  Component Value Date   LDLCALC 106 (H) 03/21/2021   Lab Results  Component Value Date   TRIG 65 03/21/2021   Lab Results  Component Value Date   CHOLHDL 3.8 03/21/2021   Lab Results  Component Value Date   HGBA1C  5.6 03/21/2021      Assessment & Plan:   Problem List Items Addressed This Visit       Musculoskeletal and Integument   Full body hives - Primary    Recurrent urticaria throughout the entire body she has very high levels of various environmental allergens Plan is to begin Pepcid 20 mg daily on a scheduled basis and cetirizine 10 mg daily  Patient will use hydroxyzine as needed  Patient to be referred to allergy and asthma       Relevant Orders   Ambulatory referral to Allergy     Other   Cervical cancer screening    Referral to gynecology has been made no call is been issued to the patient yet there is a bit of a wait on this I will see if one of our partners can do the gynecologic exam      RESOLVED: Anemia    Anemia has resolved      RESOLVED: Hypomagnesemia    Magnesium back to normal this is resolved     38 minutes spent performing history and physical reviewing old records multiple systems assessed high degree of complexity referrals placed patient educated patient education took 10 minutes using lifestyle medicine handouts Meds ordered this encounter  Medications   cetirizine (ZYRTEC) 10 MG tablet    Sig: Take 1 tablet (10 mg total) by mouth daily.    Dispense:  30 tablet    Refill:  11   famotidine (PEPCID) 20 MG tablet    Sig: Take 1 tablet (20 mg total) by mouth daily.    Dispense:  60 tablet    Refill:  6    Follow-up: Return in about 4 months (around 11/10/2021).    Shan Levans, MD

## 2021-08-17 DIAGNOSIS — F411 Generalized anxiety disorder: Secondary | ICD-10-CM | POA: Diagnosis not present

## 2021-08-17 DIAGNOSIS — F331 Major depressive disorder, recurrent, moderate: Secondary | ICD-10-CM | POA: Diagnosis not present

## 2021-08-25 DIAGNOSIS — F331 Major depressive disorder, recurrent, moderate: Secondary | ICD-10-CM | POA: Diagnosis not present

## 2021-08-25 DIAGNOSIS — F411 Generalized anxiety disorder: Secondary | ICD-10-CM | POA: Diagnosis not present

## 2021-08-31 ENCOUNTER — Other Ambulatory Visit: Payer: Self-pay

## 2021-09-03 ENCOUNTER — Encounter: Payer: Self-pay | Admitting: Critical Care Medicine

## 2021-09-04 ENCOUNTER — Ambulatory Visit: Payer: Self-pay

## 2021-09-04 DIAGNOSIS — H1032 Unspecified acute conjunctivitis, left eye: Secondary | ICD-10-CM | POA: Diagnosis not present

## 2021-09-04 NOTE — Telephone Encounter (Signed)
  Chief Complaint: left eye pain Symptoms: left eye pain, hurts under eye, pressure to eye Frequency: few weeks go Pertinent Negatives: Patient denies no discharge, fever, hurts in light, no vision changes Disposition: [] ED /[x] Urgent Care (no appt availability in office) / [] Appointment(In office/virtual)/ []  [facility]/ [] Home Care/ [] Refused Recommended Disposition /[] [facility]/ []  Follow-up with PCP Additional Notes: had cold Reason for Disposition  Looking at light causes MODERATE to SEVERE eye pain (i.e., photophobia)  Answer Assessment - Initial Assessment Questions 1. ONSET: "When did the pain start?" (e.g., minutes, hours, days)     Few weeks ago- left 2. TIMING: "Does the pain come and go, or has it been constant since it started?" (e.g., constant, intermittent, fleeting)     Pain last night 3. SEVERITY: "How bad is the pain?"   (Scale 1-10; mild, moderate or severe)   - MILD (1-3): doesn't interfere with normal activities    - MODERATE (4-7): interferes with normal activities or awakens from sleep    - SEVERE (8-10): excruciating pain and patient unable to do normal activities     6/10 pain and pressure under the eye 4. LOCATION: "Where does it hurt?"  (e.g., eyelid, eye, cheekbone)     sclera 5. CAUSE: "What do you think is causing the pain?"     Feels like something in the eye in sun and heat 6. VISION: "Do you have blurred vision or changes in your vision?"      no 7. EYE DISCHARGE: "Is there any discharge (pus) from the eye(s)?"  If Yes, ask: "What color is it?"      no 8. FEVER: "Do you have a fever?" If Yes, ask: "What is it, how was it measured, and when did it start?"      no 9. OTHER SYMPTOMS: "Do you have any other symptoms?" (e.g., headache, nasal discharge, facial rash)     Head cold,  10. PREGNANCY: "Is there any chance you are pregnant?" "When was your last menstrual period?"       N/a  Protocols used: Eye Pain and Other  Symptoms-A-AH

## 2021-09-04 NOTE — Telephone Encounter (Signed)
FYI

## 2021-09-05 NOTE — Telephone Encounter (Signed)
Called Patient and she went to [facility] and they said she had pink eye and gave her antibiotics. No question or concerns at this time.

## 2021-09-06 ENCOUNTER — Other Ambulatory Visit: Payer: Self-pay

## 2021-09-08 ENCOUNTER — Other Ambulatory Visit (HOSPITAL_COMMUNITY): Payer: Self-pay

## 2021-09-12 DIAGNOSIS — F331 Major depressive disorder, recurrent, moderate: Secondary | ICD-10-CM | POA: Diagnosis not present

## 2021-09-12 DIAGNOSIS — F411 Generalized anxiety disorder: Secondary | ICD-10-CM | POA: Diagnosis not present

## 2021-09-14 ENCOUNTER — Encounter: Payer: Self-pay | Admitting: Allergy

## 2021-09-14 ENCOUNTER — Ambulatory Visit (INDEPENDENT_AMBULATORY_CARE_PROVIDER_SITE_OTHER): Payer: BC Managed Care – PPO | Admitting: Allergy

## 2021-09-14 ENCOUNTER — Other Ambulatory Visit (HOSPITAL_COMMUNITY): Payer: Self-pay

## 2021-09-14 VITALS — BP 112/80 | HR 81 | Temp 98.2°F | Resp 16 | Ht 71.0 in | Wt 327.0 lb

## 2021-09-14 DIAGNOSIS — L299 Pruritus, unspecified: Secondary | ICD-10-CM

## 2021-09-14 DIAGNOSIS — L508 Other urticaria: Secondary | ICD-10-CM

## 2021-09-14 MED ORDER — FAMOTIDINE 20 MG PO TABS
20.0000 mg | ORAL_TABLET | Freq: Two times a day (BID) | ORAL | 5 refills | Status: DC
  Filled 2021-09-14 – 2021-11-20 (×5): qty 60, 30d supply, fill #0

## 2021-09-14 MED ORDER — CETIRIZINE HCL 10 MG PO TABS
10.0000 mg | ORAL_TABLET | Freq: Two times a day (BID) | ORAL | 5 refills | Status: DC
  Filled 2021-09-14 – 2021-10-17 (×4): qty 60, 30d supply, fill #0
  Filled 2021-11-20: qty 60, 30d supply, fill #1

## 2021-09-14 NOTE — Patient Instructions (Addendum)
-   at this time etiology of hives and swelling is unknown.  Hives can be caused by a variety of different triggers including illness/infection, foods, medications, stings, exercise, pressure, vibrations, extremes of temperature to name a few however majority of the time there is no identifiable trigger.  Your symptoms have been ongoing for >6 weeks making this chronic thus will obtain labwork to evaluate: tryptase, hive panel, alpha-gal panel - your CBC, CMP, thyroid studies were all normal - your environmental allergy panel does show detectable IgE to dust mites, cat, dog, grass pollen, tree pollen, mold, cockroach.  Allergen avoidance measures provided. - for hive control recommend the following: Zyrtec 1 tab twice a day with Pepcid 1 tab twice a day - can use hydroxyzine 25mg  daily as needed for breakthrough symptoms - if you still have hives on above regimen then would move forward with Xolair monthly injections for chronic spontaneous hives.  Xolair brochure provided  Follow-up in 2-3 months or sooner if needed

## 2021-09-14 NOTE — Progress Notes (Signed)
New Patient Note  RE: Shirley Briggs MRN: [id number] DOB: 1995-04-15 Date of Office Visit: 09/14/2021   Primary care provider: Storm Frisk, MD  Chief Complaint: hives, itch  History of present illness: Shirley Briggs is a 26 y.o. female presenting today for evaluation of hives.    She started having hives March 2023.  She states hives started the day before she left to go to [place]. She states she had washed all her clothing and has gotten her hair done.  In [place] she states she was with family that had a dog that is not well kept thus thought that it could be the dog exposure causing symptoms.  But also thought maybe it could have been her detergent.  When she got back home she got a new mattress. She has deep cleaned her home.  She changed laundry detergent. She still had/is having hives. Hives occur almost daily.   Hives can appear mostly anywhere on body.  It is itchy.  No swelling.  No joint aches/pains.  Hives can last then 30 minutes and go away.  Hives not leaving any bruising marks behind.  No fevers.  No change in medications (she did start a new vitamin but stopped and still having hives).  No new foods.  No concern for foods triggering symptoms.  Hot shower does seem to make her itchy.  The bra line area does itch often when she removes the bra.  She states several weeks ago she did have a "head cold".  She has been on zyrtec and pepcid daily.  She states at first this regimen was working well but recently has needed to take hydroxyzine which she will add in if the H1/H2 blockade is not effective enough.   No history of asthma, eczema, food allergy.  She states this year she has been outside more for work and thus has had some issues with sneezing, nasal congestion and drainage and itchy watery eyes.    He did have lab work in March by her PCP as below.  Review of systems: Review of Systems  Constitutional: Negative.   HENT: Negative.    Eyes: Negative.    Respiratory: Negative.    Cardiovascular: Negative.   Gastrointestinal: Negative.   Musculoskeletal: Negative.   Skin:  Positive for rash.  Allergic/Immunologic: Negative.   Neurological: Negative.     All other systems negative unless noted above in HPI  Past medical history: Past Medical History:  Diagnosis Date   Anemia 11/06/2017   Iron deficiency anemia due to chronic blood loss    Urticaria     Past surgical history: Past Surgical History:  Procedure Laterality Date   CLEFT LIP REPAIR      Family history:  Family History  Problem Relation Age of Onset   Anemia Mother    Allergic rhinitis Sister    Anemia Maternal Grandmother    Heart disease Maternal Grandmother    Sickle cell anemia Neg Hx    Sickle cell trait Neg Hx     Social history: Lives in an apartment with carpeting with electric heating and central cooling.  No pets in the home.  There is no concern for water damage, mildew or roaches in the home.  She is a [employer] application.  She denies a smoking history.   Medication List: Current Outpatient Medications  Medication Sig Dispense Refill   cetirizine (ZYRTEC) 10 MG tablet Take 1 tablet (10 mg total) by mouth daily. 30  tablet 11   famotidine (PEPCID) 20 MG tablet Take 1 tablet (20 mg total) by mouth daily. 60 tablet 6   hydrOXYzine (ATARAX) 25 MG tablet Take 1 tablet (25 mg total) by mouth 3 (three) times daily as needed for itching. 60 tablet 0   No current facility-administered medications for this visit.    Known medication allergies: No Known Allergies   Physical examination: Blood pressure 112/80, pulse 81, temperature 98.2 F (36.8 C), temperature source Temporal, resp. rate 16, height 5\' 11"  (1.803 m), weight (!) 327 lb (148.3 kg), SpO2 97 %.  General: Alert, interactive, in no acute distress. HEENT: PERRLA, TMs pearly gray, turbinates non-edematous without discharge, post-pharynx non erythematous. Neck: Supple without  lymphadenopathy. Lungs: Clear to auscultation without wheezing, rhonchi or rales. {no increased work of breathing. CV: Normal S1, S2 without murmurs. Abdomen: Nondistended, nontender. Skin: Scattered erythematous urticarial type lesions primarily located lateral right thigh , nonvesicular. Extremities:  No clubbing, cyanosis or edema. Neuro:   Grossly intact.  Diagnositics/Labs: Labs:  Component     Latest Ref Rng 04/12/2021  D Pteronyssinus IgE     Class 0/I kU/L 0.22 !   D Farinae IgE     Class 0/I kU/L 0.24 !   Cat Dander IgE     Class IV kU/L 12.30 !   Dog Dander IgE     Class III kU/L 1.87 !   04/14/2021 Grass IgE     Class 0/I kU/L 0.12 !   Timothy Grass IgE     Class 0/I kU/L 0.26 !   Johnson Grass IgE     Class 0/I kU/L 0.15 !   Cockroach, German IgE     Class I kU/L 0.45 !   Penicillium Chrysogen IgE     Class 0/I kU/L 0.14 !   Cladosporium Herbarum IgE     Class 0/I kU/L 0.14 !   Aspergillus Fumigatus IgE     Class 0/I kU/L 0.18 !   Alternaria Alternata IgE     Class IV kU/L 5.40 !   Maple/Box Elder IgE     Class 0 kU/L <0.10   Common Silver [place] IgE     Class I kU/L 0.52 !   [place], [place] IgE     Class 0/I kU/L 0.12 !   Oak, [place] IgE     Class II kU/L 0.93 !   Elm, American IgE     Class 0 kU/L <0.10   [place] IgE     Class 0 kU/L <0.10   Pecan, [place] IgE     Class I kU/L 0.41 !   White Mulberry IgE     Class 0 kU/L <0.10   Ragweed, Short IgE     Class 0 kU/L <0.10   Pigweed, Rough IgE     Class 0 kU/L <0.10   Sheep Sorrel IgE Qn     Class 0 kU/L <0.10   Mouse Urine IgE     Class 0 kU/L <0.10   Mucor Racemosus IgE     Class 0 kU/L <0.10   Candida Albicans IgE     Class 0/I kU/L 0.17 !   Setomelanomma Rostrat     Class I kU/L 0.48 !   [id number] Fusarium proliferatum     Class 0 kU/L <0.10   Aureobasidi Pullulans IgE     Class 0 kU/L <0.10   Phoma Betae IgE     Class I kU/L 0.49 !   [id number]-IgE Epicoccum purpur  Class II kU/L 1.13  !   Stemphylium Herbarum IgE     Class II kU/L 0.80 !     Assessment and plan: Chronic urticaria Pruritus   - at this time etiology of hives and swelling is unknown.  Hives can be caused by a variety of different triggers including illness/infection, foods, medications, stings, exercise, pressure, vibrations, extremes of temperature to name a few however majority of the time there is no identifiable trigger.  Your symptoms have been ongoing for >6 weeks making this chronic thus will obtain labwork to evaluate: tryptase, hive panel, alpha-gal panel - your CBC, CMP, thyroid studies were all normal - your environmental allergy panel does show detectable IgE to dust mites, cat, dog, grass pollen, tree pollen, mold, cockroach.  Allergen avoidance measures provided. - for hive control recommend the following: Zyrtec 1 tab twice a day with Pepcid 1 tab twice a day - can use hydroxyzine 25mg  daily as needed for breakthrough symptoms - if you still have hives on above regimen then would move forward with Xolair monthly injections for chronic spontaneous hives.  Xolair brochure provided  Follow-up in 2-3 months or sooner if needed   I appreciate the opportunity to take part in Tashira's care. Please do not hesitate to contact me with questions.  Sincerely,   , MD Allergy/Immunology [facility]

## 2021-09-15 ENCOUNTER — Ambulatory Visit: Payer: Medicaid Other | Admitting: Internal Medicine

## 2021-09-15 ENCOUNTER — Other Ambulatory Visit: Payer: Self-pay

## 2021-09-16 ENCOUNTER — Other Ambulatory Visit (HOSPITAL_COMMUNITY): Payer: Self-pay

## 2021-09-19 DIAGNOSIS — F411 Generalized anxiety disorder: Secondary | ICD-10-CM | POA: Diagnosis not present

## 2021-09-19 DIAGNOSIS — F331 Major depressive disorder, recurrent, moderate: Secondary | ICD-10-CM | POA: Diagnosis not present

## 2021-09-22 ENCOUNTER — Other Ambulatory Visit (HOSPITAL_COMMUNITY): Payer: Self-pay

## 2021-09-26 LAB — ALPHA-GAL PANEL
Allergen Lamb IgE: <0.1 kU/L
Beef IgE: <0.1 kU/L
IgE (Immunoglobulin E), Serum: 314 IU/mL (ref 6–495)
O215-IgE Alpha-Gal: <0.1 kU/L
Pork IgE: <0.1 kU/L

## 2021-09-26 LAB — CHRONIC URTICARIA: cu index: <2.1 (ref <10)

## 2021-09-26 LAB — TRYPTASE: Tryptase: 4.9 ug/L (ref 2.2–13.2)

## 2021-09-28 DIAGNOSIS — F411 Generalized anxiety disorder: Secondary | ICD-10-CM | POA: Diagnosis not present

## 2021-09-28 DIAGNOSIS — F331 Major depressive disorder, recurrent, moderate: Secondary | ICD-10-CM | POA: Diagnosis not present

## 2021-10-03 DIAGNOSIS — F411 Generalized anxiety disorder: Secondary | ICD-10-CM | POA: Diagnosis not present

## 2021-10-03 DIAGNOSIS — F331 Major depressive disorder, recurrent, moderate: Secondary | ICD-10-CM | POA: Diagnosis not present

## 2021-10-09 DIAGNOSIS — F331 Major depressive disorder, recurrent, moderate: Secondary | ICD-10-CM | POA: Diagnosis not present

## 2021-10-09 DIAGNOSIS — F411 Generalized anxiety disorder: Secondary | ICD-10-CM | POA: Diagnosis not present

## 2021-10-17 ENCOUNTER — Other Ambulatory Visit (HOSPITAL_COMMUNITY): Payer: Self-pay

## 2021-10-20 ENCOUNTER — Other Ambulatory Visit (HOSPITAL_COMMUNITY): Payer: Self-pay

## 2021-10-20 ENCOUNTER — Other Ambulatory Visit: Payer: Self-pay

## 2021-10-30 DIAGNOSIS — F411 Generalized anxiety disorder: Secondary | ICD-10-CM | POA: Diagnosis not present

## 2021-10-30 DIAGNOSIS — F331 Major depressive disorder, recurrent, moderate: Secondary | ICD-10-CM | POA: Diagnosis not present

## 2021-11-20 DIAGNOSIS — F331 Major depressive disorder, recurrent, moderate: Secondary | ICD-10-CM | POA: Diagnosis not present

## 2021-11-20 DIAGNOSIS — F411 Generalized anxiety disorder: Secondary | ICD-10-CM | POA: Diagnosis not present

## 2021-11-21 ENCOUNTER — Other Ambulatory Visit (HOSPITAL_COMMUNITY): Payer: Self-pay

## 2021-11-22 ENCOUNTER — Ambulatory Visit: Payer: BC Managed Care – PPO | Admitting: Allergy

## 2021-11-23 ENCOUNTER — Other Ambulatory Visit (HOSPITAL_COMMUNITY): Payer: Self-pay

## 2021-11-23 ENCOUNTER — Encounter (HOSPITAL_COMMUNITY): Payer: Self-pay

## 2021-11-28 NOTE — Progress Notes (Unsigned)
FOLLOW UP Date of Service/Encounter:  11/29/21   Subjective:  Shirley Briggs (DOB: Aug 12, 1995) is a 26 y.o. female who [facility] in re-evaluation of the following: Chronic urticaria History obtained from: chart review and patient.  For Review, LV was on 09/14/21  with Dr. Delorse Lek seen for intial visit for hives . She was started on Zyrtec 1 tab twice a day plus Pepcid 1 tab twice a day plus hydroxyzine 25 mg daily as needed for breakthrough, consideration of Xolair at follow-up if remained uncontrolled.  ------------------------ Pertinent History/Diagnostics:  Hives : Started March 2023.  No clear trigger.  No swelling, joint aches or pains.  Last around 30 minutes.  No bruising, no fevers. -- March 2023 environmental blood work-positive to cat, dog, cockroach, oak tree, birch tree, pecan tree, molds, borderline to dust mites, grass pollen, very small pollens, cedar tree -- Hives Labs: Negative alpha gal, total IgE 314, normal baseline Tryptase (4.9), CU index normal range ----------------------------------------- Today presents for follow-up. She has not taken her zyrtec or pepcid in one week, but was taking twice a day.  That was working for her, but if she misses a night, she will be itchy the next day.  She has been taking hydroxyzine three times per day since running out of Zyrtec and Pepcid.  At night time is when her hives tend to exacerbate. She is content taking medications to control her hives and know these work. She would be open to xolair should this regimen not work.  Allergies as of 11/29/2021   No Known Allergies      Medication List        Accurate as of November 29, 2021  5:42 PM. If you have any questions, ask your nurse or doctor.          cetirizine 10 MG tablet Commonly known as: ZYRTEC Take 1 tablet (10 mg total) by mouth daily.   famotidine 20 MG tablet Commonly known as: Pepcid Take 1 tablet  (20 mg total) by mouth daily.   hydrOXYzine 25 MG tablet Commonly known as: ATARAX Take 1 tablet (25 mg total) by mouth 3 (three) times daily as needed for itching.       Past Medical History:  Diagnosis Date   Anemia 11/06/2017   Iron deficiency anemia due to chronic blood loss    Urticaria    Past Surgical History:  Procedure Laterality Date   CLEFT LIP REPAIR     Otherwise, there have been no changes to her past medical history, surgical history, family history, or social history.  ROS: All others negative except as noted per HPI.   Objective:  BP 130/72   Pulse 85   Temp 98 F (36.7 C)   Resp 20   Ht 5\' 11"  (1.803 m)   Wt (!) 339 lb 14.4 oz (154.2 kg)   SpO2 100%   BMI 47.41 kg/m  Body mass index is 47.41 kg/m. Physical Exam: General Appearance:  Alert, cooperative, no distress, appears stated age  Head:  Normocephalic, without obvious abnormality, atraumatic  Eyes:  Conjunctiva clear, EOM's intact  Nose: Nares normal, normal mucosa, no visible anterior polyps, and septum midline  Throat: Lips, tongue normal; teeth and gums normal, normal posterior oropharynx and tonsils 2+  Neck: Supple, symmetrical  Lungs:   clear to auscultation bilaterally, Respirations unlabored, no coughing  Heart:  regular rate and rhythm and no murmur, Appears well perfused  Extremities: No  edema  Skin: Skin color, texture, turgor normal, no rashes or lesions on visualized portions of skin  Neurologic: No gross deficits   Assessment/Plan  Chronic idiopathic urticaria: Controlled with medication regimen as below  - at this time etiology of hives and swelling is unknown.  Hives can be caused by a variety of different triggers including illness/infection, foods, medications, stings, exercise, pressure, vibrations, extremes of temperature to name a few however majority of the time there is no identifiable trigger. - your CBC, CMP, thyroid studies were all normal - your environmental  allergy panel does show detectable IgE to dust mites, cat, dog, grass pollen, tree pollen, mold, cockroach.  Allergen avoidance measures provided. - for hive control recommend the following: Zyrtec (cetirizine) 1 tab twice a day with Pepcid (famotidine) 1 tab twice a day- maximum 2 tablets twice a day - can use hydroxyzine 25mg  daily as needed for breakthrough symptoms - if you still have hives on above regimen then would move forward with Xolair monthly injections for chronic spontaneous hives.    Follow-up in 6 months or sooner if needed  Sigurd Sos, MD  [facility]

## 2021-11-29 ENCOUNTER — Encounter: Payer: Self-pay | Admitting: Internal Medicine

## 2021-11-29 ENCOUNTER — Other Ambulatory Visit (HOSPITAL_COMMUNITY): Payer: Self-pay

## 2021-11-29 ENCOUNTER — Ambulatory Visit (INDEPENDENT_AMBULATORY_CARE_PROVIDER_SITE_OTHER): Payer: BC Managed Care – PPO | Admitting: Internal Medicine

## 2021-11-29 VITALS — BP 130/72 | HR 85 | Temp 98.0°F | Resp 20 | Ht 71.0 in | Wt 339.9 lb

## 2021-11-29 DIAGNOSIS — L508 Other urticaria: Secondary | ICD-10-CM

## 2021-11-29 MED ORDER — CETIRIZINE HCL 10 MG PO TABS
10.0000 mg | ORAL_TABLET | Freq: Every day | ORAL | 5 refills | Status: DC
  Filled 2021-11-29 – 2022-02-12 (×5): qty 60, 60d supply, fill #0

## 2021-11-29 MED ORDER — FAMOTIDINE 20 MG PO TABS
20.0000 mg | ORAL_TABLET | Freq: Every day | ORAL | 6 refills | Status: DC
Start: 2021-11-29 — End: 2022-05-23
  Filled 2021-11-29: qty 60, 60d supply, fill #0
  Filled 2022-01-12 – 2022-02-12 (×4): qty 60, 60d supply, fill #1

## 2021-11-29 NOTE — Patient Instructions (Addendum)
-   at this time etiology of hives and swelling is unknown.  Hives can be caused by a variety of different triggers including illness/infection, foods, medications, stings, exercise, pressure, vibrations, extremes of temperature to name a few however majority of the time there is no identifiable trigger.   - your CBC, CMP, thyroid studies, were all normal - your environmental allergy panel does show detectable IgE to dust mites, cat, dog, grass pollen, tree pollen, mold, cockroach.  Allergen avoidance measures provided. - for hive control recommend the following: Zyrtec (cetirizine) 1 tab twice a day with Pepcid (famotidine) 1 tab twice a day- maximum 2 tablets twice a day - can use hydroxyzine 25mg  daily as needed for breakthrough symptoms - if you still have hives on above regimen then would move forward with Xolair monthly injections for chronic spontaneous hives.    Follow-up in 6 months or sooner if needed  It was a pleasure meeting you in clinic today! Thank you for allowing me to participate in your care.  , MD [facility]

## 2021-11-30 ENCOUNTER — Other Ambulatory Visit (HOSPITAL_COMMUNITY): Payer: Self-pay

## 2021-12-04 ENCOUNTER — Other Ambulatory Visit (HOSPITAL_COMMUNITY): Payer: Self-pay

## 2021-12-04 DIAGNOSIS — F411 Generalized anxiety disorder: Secondary | ICD-10-CM | POA: Diagnosis not present

## 2021-12-04 DIAGNOSIS — F331 Major depressive disorder, recurrent, moderate: Secondary | ICD-10-CM | POA: Diagnosis not present

## 2022-01-01 DIAGNOSIS — F411 Generalized anxiety disorder: Secondary | ICD-10-CM | POA: Diagnosis not present

## 2022-01-01 DIAGNOSIS — F331 Major depressive disorder, recurrent, moderate: Secondary | ICD-10-CM | POA: Diagnosis not present

## 2022-01-12 ENCOUNTER — Other Ambulatory Visit: Payer: Self-pay

## 2022-01-12 ENCOUNTER — Other Ambulatory Visit (HOSPITAL_COMMUNITY): Payer: Self-pay

## 2022-01-19 DIAGNOSIS — F411 Generalized anxiety disorder: Secondary | ICD-10-CM | POA: Diagnosis not present

## 2022-01-19 DIAGNOSIS — F331 Major depressive disorder, recurrent, moderate: Secondary | ICD-10-CM | POA: Diagnosis not present

## 2022-01-20 ENCOUNTER — Other Ambulatory Visit (HOSPITAL_COMMUNITY): Payer: Self-pay

## 2022-01-30 DIAGNOSIS — F411 Generalized anxiety disorder: Secondary | ICD-10-CM | POA: Diagnosis not present

## 2022-01-30 DIAGNOSIS — F331 Major depressive disorder, recurrent, moderate: Secondary | ICD-10-CM | POA: Diagnosis not present

## 2022-02-12 ENCOUNTER — Other Ambulatory Visit (HOSPITAL_COMMUNITY): Payer: Self-pay

## 2022-02-12 DIAGNOSIS — F331 Major depressive disorder, recurrent, moderate: Secondary | ICD-10-CM | POA: Diagnosis not present

## 2022-02-12 DIAGNOSIS — F411 Generalized anxiety disorder: Secondary | ICD-10-CM | POA: Diagnosis not present

## 2022-02-26 DIAGNOSIS — F411 Generalized anxiety disorder: Secondary | ICD-10-CM | POA: Diagnosis not present

## 2022-02-26 DIAGNOSIS — F331 Major depressive disorder, recurrent, moderate: Secondary | ICD-10-CM | POA: Diagnosis not present

## 2022-03-12 DIAGNOSIS — F411 Generalized anxiety disorder: Secondary | ICD-10-CM | POA: Diagnosis not present

## 2022-03-12 DIAGNOSIS — F331 Major depressive disorder, recurrent, moderate: Secondary | ICD-10-CM | POA: Diagnosis not present

## 2022-03-26 DIAGNOSIS — F411 Generalized anxiety disorder: Secondary | ICD-10-CM | POA: Diagnosis not present

## 2022-03-26 DIAGNOSIS — F331 Major depressive disorder, recurrent, moderate: Secondary | ICD-10-CM | POA: Diagnosis not present

## 2022-04-09 DIAGNOSIS — F331 Major depressive disorder, recurrent, moderate: Secondary | ICD-10-CM | POA: Diagnosis not present

## 2022-04-09 DIAGNOSIS — F411 Generalized anxiety disorder: Secondary | ICD-10-CM | POA: Diagnosis not present

## 2022-04-23 DIAGNOSIS — F331 Major depressive disorder, recurrent, moderate: Secondary | ICD-10-CM | POA: Diagnosis not present

## 2022-04-23 DIAGNOSIS — F411 Generalized anxiety disorder: Secondary | ICD-10-CM | POA: Diagnosis not present

## 2022-05-07 DIAGNOSIS — F411 Generalized anxiety disorder: Secondary | ICD-10-CM | POA: Diagnosis not present

## 2022-05-07 DIAGNOSIS — F331 Major depressive disorder, recurrent, moderate: Secondary | ICD-10-CM | POA: Diagnosis not present

## 2022-05-23 ENCOUNTER — Other Ambulatory Visit: Payer: Self-pay

## 2022-05-23 ENCOUNTER — Ambulatory Visit: Payer: Self-pay | Attending: Critical Care Medicine | Admitting: Critical Care Medicine

## 2022-05-23 ENCOUNTER — Encounter: Payer: Self-pay | Admitting: Critical Care Medicine

## 2022-05-23 DIAGNOSIS — L509 Urticaria, unspecified: Secondary | ICD-10-CM

## 2022-05-23 DIAGNOSIS — Z124 Encounter for screening for malignant neoplasm of cervix: Secondary | ICD-10-CM

## 2022-05-23 MED ORDER — HYDROXYZINE HCL 25 MG PO TABS
25.0000 mg | ORAL_TABLET | Freq: Three times a day (TID) | ORAL | 0 refills | Status: AC | PRN
  Filled 2022-05-23: qty 60, 20d supply, fill #0

## 2022-05-23 MED ORDER — CETIRIZINE HCL 10 MG PO TABS
10.0000 mg | ORAL_TABLET | Freq: Every day | ORAL | 5 refills | Status: AC
  Filled 2022-05-23: qty 30, 30d supply, fill #0

## 2022-05-23 MED ORDER — FAMOTIDINE 20 MG PO TABS
20.0000 mg | ORAL_TABLET | Freq: Every day | ORAL | 6 refills | Status: AC
  Filled 2022-05-23: qty 30, 30d supply, fill #0

## 2022-05-23 NOTE — Assessment & Plan Note (Signed)
Referral to gynecology made 

## 2022-05-23 NOTE — Patient Instructions (Addendum)
A referral to the [facility] be made for a Pap smear they will accept patients without insurance  All medications refilled  We discussed the lifestyle approach you have the handout from that previously for weight loss and see below get back into walking 30 minutes 5 times a week  Labs today include hemoglobin A1c  Let us know when you get your insurance from the bank we can send you to a dentist In the bariatric weight loss clinic  Return to Dr. Delford Field 1 year         Advice for Weight Management   -For most of [place] the best way to lose weight is by diet management. Generally speaking, diet management means consuming less calories intentionally which over time brings about progressive weight loss.  This can be achieved more effectively by avoiding ultra processed carbohydrates, processed meats, unhealthy fats.    It is critically important to know your numbers: how much calorie you are consuming and how much calorie you need. More importantly, our carbohydrates sources should be unprocessed naturally occurring  complex starch food items.  It is always important to balance nutrition also by  appropriate intake of proteins (mainly plant-based), healthy fats/oils, plenty of fruits and vegetables.    -The American College of Lifestyle Medicine (ACL M) recommends nutrition derived mostly from Whole Food, Plant Predominant Sources example an apple instead of applesauce or apple pie. Eat Plenty of vegetables, Mushrooms, fruits, Legumes, Whole Grains, Nuts, seeds in lieu of processed meats, processed snacks/pastries red meat, poultry, eggs.  Use only water or unsweetened tea for hydration.  The College also recommends the need to stay away from risky substances including alcohol, smoking; obtaining 7-9 hours of restorative sleep, at least 150 minutes of moderate intensity exercise weekly, importance of healthy social connections, and being mindful of stress and seek help when it is overwhelming.      -Sticking to a routine mealtime to eat 3 meals a day and avoiding unnecessary snacks is shown to have a big role in weight control. Under normal circumstances, the only time we burn stored energy is when we are hungry, so allow  some hunger to take place- hunger means no food between appropriate meal times, only water.  It is not advisable to starve.    -It is better to avoid simple carbohydrates including: Cakes, Sweet Desserts, Ice Cream, Soda (diet and regular), Sweet Tea, Candies, Chips, Cookies, Store Bought Juices, Alcohol in Excess of  1-2 drinks a day, Lemonade,  Artificial Sweeteners, Doughnuts, Coffee Creamers, "Sugar-free" Products, etc, etc.  This is not a complete list...Marland Kitchen.    -Consulting with certified diabetes educators is proven to provide you with the most accurate and current information on diet.  Also, you may be  interested in discussing diet options/exchanges , we can schedule a visit with Norm Salt, RDN, CDE for individualized nutrition education.   -Exercise: If you are able: 30 -60 minutes a day ,4 days a week, or 150 minutes of moderate intensity exercise weekly.    The longer the better if tolerated.  Combine stretch, strength, and aerobic activities.  If you were told in the past that you have high risk for cardiovascular diseases, or if you are currently symptomatic, you may seek evaluation by your heart doctor prior to initiating moderate to intense exercise programs.  Additional Care Considerations for Diabetes/Prediabetes     -Diabetes  is a chronic disease.  The most important care consideration is regular follow-up with your diabetes care provider with the goal being avoiding or delaying its complications and to take advantage of advances in medications and technology.  If appropriate actions are taken early enough, type 2 diabetes can even be reversed.  Seek information from the right source.   - Whole Food, Plant Predominant  Nutrition is highly recommended: Eat Plenty of vegetables, Mushrooms, fruits, Legumes, Whole Grains, Nuts, seeds in lieu of processed meats, processed snacks/pastries red meat, poultry, eggs as recommended by [employer] of  Lifestyle Medicine (ACLM).   -Type 2 diabetes is known to coexist with other important comorbidities such as high blood pressure and high cholesterol.  It is critical to control not only the diabetes but also the high blood pressure and high cholesterol to minimize and delay the risk of complications including coronary artery disease, stroke, amputations, blindness, etc.  The good news is that this diet recommendation for type 2 diabetes is also very helpful for managing high cholesterol and high blood blood pressure.   - Studies showed that people with diabetes will benefit from a class of medications known as ACE inhibitors and statins.  Unless there are specific reasons not to be on these medications, the standard of care is to consider getting one from these groups of medications at an optimal doses.  These medications are generally considered safe and proven to help protect the heart and the kidneys.     - People with diabetes are encouraged to initiate and maintain regular follow-up with eye doctors, foot doctors, dentists , and if necessary heart and kidney doctors.      - It is highly recommended that people with diabetes quit smoking or stay away from smoking, and get yearly  flu vaccine and pneumonia vaccine at least every 5 years.  See above for additional recommendations on exercise, sleep, stress management , and healthy social connections.

## 2022-05-23 NOTE — Assessment & Plan Note (Signed)
Resolved with use of antihistamine and H2 blocker continue same she does have multiple allergies can send her to allergist if needed in the future when she has insurance

## 2022-05-23 NOTE — Progress Notes (Signed)
Established Patient Office Visit  Subjective:  Patient ID: Shirley Briggs, female    DOB: 02-06-1995  Age: 27 y.o. MRN: [id number]  CC:  Obesity  HPI 03/2021 Shirley Briggs presents for primary care follow-up.  Patient is wishing to have repeat labs to assess her prediabetes status.  She had lost some weight and today is down to 302 pounds She was in the 320 pound range.  She does find that she binge eats and is triggered by stress.  She has had a lot of stress recently and that she lost close family members.  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  She does not exercise.  She is not sleeping well.  She does not have close connections.  She does not smoke or drink alcohol or use other substances.  Patient also has a history of low iron levels several years ago detected.  She had excess bleeding with periods.  She states now that with her menstrual cycle she does not have heavy bleeding.  She was on Provera in the past but this had side effects with weight gain.  It did help with menstrual bleeding.  She takes some magnesium supplements over-the-counter.  She is on no other particular medications.  She does have a great deal of stress and anxiety but is not suicidal.  Her GAD-7 scores and PHQ-9 scores are high.  She does have nausea and some reflux symptoms.  Patient does need a Pap smear.  She denies wishing to have any birth control measures.  She does not though wish to have a pregnancy at this time.  She does not have any active partners.  6/27 This patient seen in return follow-up complains of bilateral urticaria noted at the last visit she had multiple environmental allergies.  She is denies any change in any hygiene she does use Tide detergent to clean her close.  This began in March and has persisted.  Hydroxyzine helps to some degree.  She also gets this condition when she becomes anxious.  She has had mental health counseling and will need continued mental health  care.  Patient does need a Pap smear will need a gynecology referral [facility]  On arrival blood pressure 128/83.  There are no other complaints.  At the last visit iron levels were and blood counts were normal.  Thyroid was normal lipid panel was at goal  05/23/22 Patient here for return follow-up Patient does have a history of obesity and has continued to gain weight.  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  She needs a bariatric referral need to wait on this.  Also needs a hemoglobin A1c followed up as well.  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  Patient needs a Pap smear and due to her obesity we will have to go to gynecology for this.  There are no other complaints.  Urticaria has resolved with use of Zyrtec Pepcid and Atarax  Past Medical History:  Diagnosis Date   Anemia 11/06/2017   Iron deficiency anemia due to chronic blood loss    Urticaria     Past Surgical History:  Procedure Laterality Date   CLEFT LIP REPAIR      Family History  Problem Relation Age of Onset   Anemia Mother    Allergic rhinitis Sister  Anemia Maternal Grandmother    Heart disease Maternal Grandmother    Sickle cell anemia Neg Hx    Sickle cell trait Neg Hx     Social History   Socioeconomic History   Marital status: Single    Spouse name: Not on file   Number of children: Not on file   Years of education: Not on file   Highest education level: Not on file  Occupational History   Not on file  Tobacco Use   Smoking status: Never    Passive exposure: Current   Smokeless tobacco: Never  Vaping Use   Vaping Use: Never used  Substance and Sexual Activity   Alcohol use: Yes   Drug use: Never   Sexual activity: Never  Other Topics Concern   Not on file  Social History Narrative   Not on file   Social Determinants of Health   Financial Resource  Strain: Not on file  Food Insecurity: No Food Insecurity (08/12/2019)   Hunger Vital Sign    Worried About Running Out of Food in the Last Year: Never true    Ran Out of Food in the Last Year: Never true  Transportation Needs: Unmet Transportation Needs (08/12/2019)   PRAPARE - Administrator, Civil Service (Medical): Yes    Lack of Transportation (Non-Medical): Yes  Physical Activity: Not on file  Stress: Not on file  Social Connections: Not on file  Intimate Partner Violence: Not on file    Outpatient Medications Prior to Visit  Medication Sig Dispense Refill   cetirizine (ZYRTEC) 10 MG tablet Take 1 tablet (10 mg total) by mouth daily. 60 tablet 5   famotidine (PEPCID) 20 MG tablet Take 1 tablet (20 mg total) by mouth daily. 60 tablet 6   hydrOXYzine (ATARAX) 25 MG tablet Take 1 tablet (25 mg total) by mouth 3 (three) times daily as needed for itching. (Patient not taking: Reported on 05/23/2022) 60 tablet 0   No facility-administered medications prior to visit.    No Known Allergies  ROS Review of Systems  Constitutional:  Negative for appetite change, chills, diaphoresis, fatigue and fever.  HENT:  Negative for congestion, hearing loss, nosebleeds, sore throat and tinnitus.   Eyes:  Negative for photophobia and redness.  Respiratory:  Negative for cough, shortness of breath, wheezing and stridor.   Cardiovascular:  Negative for chest pain, palpitations and leg swelling.  Gastrointestinal:  Negative for abdominal pain, blood in stool, constipation, diarrhea, nausea and vomiting.  Endocrine: Negative for polydipsia.  Genitourinary:  Negative for dysuria, flank pain, frequency, hematuria and urgency.  Musculoskeletal:  Negative for back pain, myalgias and neck pain.  Skin:  Negative for rash.  Allergic/Immunologic: Negative for environmental allergies.  Neurological:  Negative for dizziness, tremors, seizures, weakness and headaches.  Hematological:  Does not  bruise/bleed easily.  Psychiatric/Behavioral:  Negative for decreased concentration, dysphoric mood, self-injury, sleep disturbance and suicidal ideas. The patient is not nervous/anxious.       Objective:    Physical Exam Vitals reviewed.  Constitutional:      Appearance: Normal appearance. She is well-developed. She is obese. She is not diaphoretic.  HENT:     Head: Normocephalic and atraumatic.     Right Ear: Tympanic membrane normal.     Left Ear: Tympanic membrane normal.     Nose: Nose normal. No nasal deformity, septal deviation, mucosal edema or rhinorrhea.     Right Sinus: No maxillary sinus tenderness or frontal sinus tenderness.  Left Sinus: No maxillary sinus tenderness or frontal sinus tenderness.     Mouth/Throat:     Mouth: Mucous membranes are moist.     Pharynx: Oropharynx is clear. No oropharyngeal exudate.  Eyes:     General: No scleral icterus.    Conjunctiva/sclera: Conjunctivae normal.     Pupils: Pupils are equal, round, and reactive to light.  Neck:     Thyroid: No thyromegaly.     Vascular: No carotid bruit or JVD.     Trachea: Trachea normal. No tracheal tenderness or tracheal deviation.  Cardiovascular:     Rate and Rhythm: Normal rate and regular rhythm.     Chest Wall: PMI is not displaced.     Pulses: Normal pulses. No decreased pulses.     Heart sounds: Normal heart sounds, S1 normal and S2 normal. Heart sounds not distant. No murmur heard.    No systolic murmur is present.     No diastolic murmur is present.     No friction rub. No gallop. No S3 or S4 sounds.  Pulmonary:     Effort: No tachypnea, accessory muscle usage or respiratory distress.     Breath sounds: No stridor. No decreased breath sounds, wheezing, rhonchi or rales.  Chest:     Chest wall: No tenderness.  Abdominal:     General: Bowel sounds are normal. There is no distension.     Palpations: Abdomen is soft. Abdomen is not rigid.     Tenderness: There is no abdominal  tenderness. There is no guarding or rebound.  Musculoskeletal:        General: Normal range of motion.     Cervical back: Normal range of motion and neck supple. No edema, erythema or rigidity. No muscular tenderness. Normal range of motion.  Lymphadenopathy:     Head:     Right side of head: No submental or submandibular adenopathy.     Left side of head: No submental or submandibular adenopathy.     Cervical: No cervical adenopathy.  Skin:    General: Skin is warm and dry.     Coloration: Skin is not pale.     Findings: No rash.     Nails: There is no clubbing.  Neurological:     General: No focal deficit present.     Mental Status: She is alert and oriented to person, place, and time.     Sensory: No sensory deficit.  Psychiatric:        Attention and Perception: Attention and perception normal.        Mood and Affect: Mood and affect normal.        Speech: Speech normal.        Behavior: Behavior normal. Behavior is cooperative.        Thought Content: Thought content normal. Thought content does not include homicidal or suicidal ideation. Thought content does not include homicidal or suicidal plan.        Cognition and Memory: Cognition and memory normal.        Judgment: Judgment normal.     BP 123/75 (BP Location: Left Arm, Patient Position: Sitting, Cuff Size: Large)   Pulse 86   Temp 98.5 F (36.9 C) (Oral)   Ht 5\' 11"  (1.803 m)   Wt (!) 337 lb (152.9 kg)   SpO2 98%   BMI 47.00 kg/m  Wt Readings from Last 3 Encounters:  05/23/22 (!) 337 lb (152.9 kg)  11/29/21 (!) 339 lb 14.4 oz (154.2 kg)  09/14/21 (!) 327 lb (148.3 kg)     Health Maintenance Due  Topic Date Due   HPV VACCINES (1 - 2-dose series) Never done   PAP-Cervical Cytology Screening  Never done   PAP SMEAR-Modifier  Never done       Topic Date Due   HPV VACCINES (1 - 2-dose series) Never done    Lab Results  Component Value Date   TSH 1.110 03/21/2021   Lab Results  Component Value Date    WBC 8.7 03/21/2021   HGB 12.1 03/21/2021   HCT 37.6 03/21/2021   MCV 78 (L) 03/21/2021   PLT 289 03/21/2021   Lab Results  Component Value Date   NA 142 03/21/2021   K 5.0 03/21/2021   CO2 23 03/21/2021   GLUCOSE 87 03/21/2021   BUN 8 03/21/2021   CREATININE 0.67 03/21/2021   BILITOT 0.3 03/21/2021   ALKPHOS 115 03/21/2021   AST 13 03/21/2021   ALT 9 03/21/2021   PROT 7.3 03/21/2021   ALBUMIN 4.3 03/21/2021   CALCIUM 9.5 03/21/2021   ANIONGAP 8 08/17/2018   EGFR 124 03/21/2021   Lab Results  Component Value Date   CHOL 162 03/21/2021   Lab Results  Component Value Date   HDL 43 03/21/2021   Lab Results  Component Value Date   LDLCALC 106 (H) 03/21/2021   Lab Results  Component Value Date   TRIG 65 03/21/2021   Lab Results  Component Value Date   CHOLHDL 3.8 03/21/2021   Lab Results  Component Value Date   HGBA1C 5.6 03/21/2021      Assessment & Plan:   Problem List Items Addressed This Visit       Musculoskeletal and Integument   Full body hives    Resolved with use of antihistamine and H2 blocker continue same she does have multiple allergies can send her to allergist if needed in the future when she has insurance        Other   Morbid obesity (HCC)    The following Lifestyle Medicine recommendations according to American College of Lifestyle Medicine [facility]) were discussed and offered to patient who agrees to start the journey:  A. Whole Foods, Plant-based plate comprising of fruits and vegetables, plant-based proteins, whole-grain carbohydrates was discussed in detail with the patient.   A list for source of those nutrients were also provided to the patient.  Patient will use only water or unsweetened tea for hydration. B.  The need to stay away from risky substances including alcohol, smoking; obtaining 7 to 9 hours of restorative sleep, at least 150 minutes of moderate intensity exercise weekly, the importance of healthy social connections,  and  stress reduction techniques were discussed. C.  A full color page of  Calorie density of various food groups per pound showing examples of each food groups was provided to the patient.       Relevant Orders   Ambulatory referral to Gynecology   Hemoglobin A1c   Cervical cancer screening - Primary    Referral to gynecology made      Relevant Orders   Ambulatory referral to Gynecology  38 minutes spent performing history and physical reviewing old records multiple systems assessed high degree of complexity referrals placed patient educated patient education took 10 minutes using lifestyle medicine handouts Meds ordered this encounter  Medications   cetirizine (ZYRTEC) 10 MG tablet    Sig: Take 1 tablet (10 mg total) by mouth daily.    Dispense:  60 tablet    Refill:  5   famotidine (PEPCID) 20 MG tablet    Sig: Take 1 tablet (20 mg total) by mouth daily.    Dispense:  60 tablet    Refill:  6   hydrOXYzine (ATARAX) 25 MG tablet    Sig: Take 1 tablet (25 mg total) by mouth 3 (three) times daily as needed for itching.    Dispense:  60 tablet    Refill:  0    Follow-up: Return in about 1 year (around 05/23/2023) for obesity.    Shan Levans, MD

## 2022-05-23 NOTE — Assessment & Plan Note (Signed)

## 2022-05-24 ENCOUNTER — Telehealth: Payer: Self-pay

## 2022-05-24 ENCOUNTER — Other Ambulatory Visit: Payer: Self-pay

## 2022-05-24 ENCOUNTER — Encounter: Payer: Self-pay | Admitting: Critical Care Medicine

## 2022-05-24 LAB — HEMOGLOBIN A1C
Est. average glucose Bld gHb Est-mCnc: 128 mg/dL
Hgb A1c MFr Bld: 6.1 % — ABNORMAL HIGH (ref 4.8–5.6)

## 2022-05-24 NOTE — Progress Notes (Signed)
A1C shows prediabetes  follow healthy lifestyle as recommended and could use ozempic but needs insurance coverage

## 2022-05-24 NOTE — Telephone Encounter (Signed)
Pt was called and is aware of results, DOB was confirmed.  ?

## 2022-05-24 NOTE — Telephone Encounter (Signed)
-----   Message from Storm Frisk, MD sent at 05/24/2022  8:31 AM EDT ----- A1C shows prediabetes  follow healthy lifestyle as recommended and could use ozempic but needs insurance coverage

## 2022-05-29 NOTE — Progress Notes (Deleted)
FOLLOW UP Date of Service/Encounter:  05/29/22   Subjective:  Shirley Briggs (DOB: January 30, 1995) is a 27 y.o. female who [facility] in re-evaluation of the following:  Chronic urticaria  History obtained from: chart review and {Persons; PED relatives w/patient:19415::"patient"}.  For Review, LV was on 11/29/21  with Dr.Amiera Herzberg seen for routine follow-up. See below for summary of history and diagnostics.  Therapeutic plans/changes recommended: Hives controlled on zyrtec BID + pepcid. Not intersted in Xolair, unless this regimen fails.  Pertinent History/Diagnostics:  Hives : Started March 2023.  No clear trigger.  No swelling, joint aches or pains.  Last around 30 minutes.  No bruising, no fevers. -- March 2023 environmental blood work-positive to cat, dog, cockroach, oak tree, birch tree, pecan tree, molds, borderline to dust mites, grass pollen, very small pollens, cedar tree -- Hives Labs: Negative alpha gal, total IgE 314, normal baseline Tryptase (4.9), CU index normal range  Today presents for follow-up.    Allergies as of 05/30/2022   No Known Allergies      Medication List        Accurate as of May 29, 2022 11:15 AM. If you have any questions, ask your nurse or doctor.          cetirizine 10 MG tablet Commonly known as: ZYRTEC Take 1 tablet (10 mg total) by mouth daily.   famotidine 20 MG tablet Commonly known as: Pepcid Take 1 tablet (20 mg total) by mouth daily.   hydrOXYzine 25 MG tablet Commonly known as: ATARAX Take 1 tablet (25 mg total) by mouth 3 (three) times daily as needed for itching.       Past Medical History:  Diagnosis Date   Anemia 11/06/2017   Iron deficiency anemia due to chronic blood loss    Urticaria    Past Surgical History:  Procedure Laterality Date   CLEFT LIP REPAIR     Otherwise, there have been no changes to her past medical history, surgical history, family history,  or social history.  ROS: All others negative except as noted per HPI.   Objective:  There were no vitals taken for this visit. There is no height or weight on file to calculate BMI. Physical Exam: General Appearance:  Alert, cooperative, no distress, appears stated age  Head:  Normocephalic, without obvious abnormality, atraumatic  Eyes:  Conjunctiva clear, EOM's intact  Nose: Nares normal, {Blank multiple:19196:a:"***","hypertrophic turbinates","normal mucosa","no visible anterior polyps","septum midline"}  Throat: Lips, tongue normal; teeth and gums normal, {Blank multiple:19196:a:"***","normal posterior oropharynx","tonsils 2+","tonsils 3+","no tonsillar exudate","+ cobblestoning","surgically absent tonsils"}  Neck: Supple, symmetrical  Lungs:   {Blank multiple:19196:a:"***","clear to auscultation bilaterally","end-expiratory wheezing","wheezing throughout"}, Respirations unlabored, {Blank multiple:19196:a:"***","no coughing","intermittent dry coughing"}  Heart:  {Blank multiple:19196:a:"***","regular rate and rhythm","no murmur"}, Appears well perfused  Extremities: No edema  Skin: {Blank multiple:19196:a:"***","Skin color, texture, turgor normal","no rashes or lesions on visualized portions of skin"}  Neurologic: No gross deficits   Reviewed: ***  Spirometry:  Tracings reviewed. Her effort: {Blank single:19197::"Good reproducible efforts.","It was hard to get consistent efforts and there is a question as to whether this reflects a maximal maneuver.","Poor effort, data can not be interpreted.","Variable effort-results affected.","decent for first attempt at spirometry."} FVC: ***L FEV1: ***L, ***% predicted FEV1/FVC ratio: ***% Interpretation: {Blank single:19197::"Spirometry consistent with mild obstructive disease","Spirometry consistent with moderate obstructive disease","Spirometry consistent with severe obstructive disease","Spirometry consistent with possible restrictive  disease","Spirometry consistent with mixed obstructive and restrictive disease","Spirometry uninterpretable due to technique","Spirometry consistent with normal  pattern","No overt abnormalities noted given today's efforts"}.  Please see scanned spirometry results for details.  Skin Testing: {Blank single:19197::"Select foods","Environmental allergy panel","Environmental allergy panel and select foods","Food allergy panel","None","Deferred due to recent antihistamines use","deferred due to recent reaction"}. ***Adequate positive and negative controls Results discussed with patient/family.   {Blank single:19197::"Allergy testing results were read and interpreted by myself, documented by clinical staff."," "}  Assessment/Plan   ***  Tonny Bollman, MD  [facility]

## 2022-05-30 ENCOUNTER — Ambulatory Visit: Payer: BC Managed Care – PPO | Admitting: Internal Medicine

## 2022-06-26 ENCOUNTER — Encounter: Payer: Self-pay | Admitting: Critical Care Medicine

## 2022-07-03 ENCOUNTER — Encounter: Payer: Self-pay | Admitting: Internal Medicine

## 2022-07-03 ENCOUNTER — Telehealth: Payer: Self-pay

## 2022-07-03 NOTE — Telephone Encounter (Signed)
Shirley Briggs see pt requests pls assist ty

## 2022-07-03 NOTE — Telephone Encounter (Signed)
I called the patient regarding her questions about insurance.  She explained that she has a new job and missed the deadline to sign up for insurance so she is interested in her options. She is over income for Medicaid and is agreeable to placing a referral to [employer] [facility] [place] for assistance with exploring her options with the Marketplace.  Referral then sent to [facility]

## 2022-07-17 NOTE — Progress Notes (Deleted)
FOLLOW UP Date of Service/Encounter:  07/17/22   Subjective:  Shirley Briggs (DOB: October 24, 1995) is a 27 y.o. female who [facility] in re-evaluation of the following: Chronic urticaria History obtained from: chart review and {Persons; PED relatives w/patient:19415::"patient"}.  For Review, LV was on 11/29/21  with Dr.Jacquese Cassarino seen for routine follow-up. See below for summary of history and diagnostics.  Therapeutic plans/changes recommended: Still having highs but controlled on Zyrtec twice daily.  Happy with level of control, but aware of Xolair should she become uncontrolled. ----------------------------------------------------- Pertinent History/Diagnostics:  Hives : Started March 2023.  No clear trigger.  No swelling, joint aches or pains.  Last around 30 minutes.  No bruising, no fevers. -- March 2023 environmental blood work-positive to cat, dog, cockroach, oak tree, birch tree, pecan tree, molds, borderline to dust mites, grass pollen, very small pollens, cedar tree -- Hives Labs: Negative alpha gal, total IgE 314, normal baseline Tryptase (4.9), CU index normal range --------------------------------------------------- Today presents for follow-up. ***  Allergies as of 07/18/2022   No Known Allergies      Medication List        Accurate as of July 17, 2022  1:18 PM. If you have any questions, ask your nurse or doctor.          cetirizine 10 MG tablet Commonly known as: ZYRTEC Take 1 tablet (10 mg total) by mouth daily.   famotidine 20 MG tablet Commonly known as: Pepcid Take 1 tablet (20 mg total) by mouth daily.   hydrOXYzine 25 MG tablet Commonly known as: ATARAX Take 1 tablet (25 mg total) by mouth 3 (three) times daily as needed for itching.       Past Medical History:  Diagnosis Date   Anemia 11/06/2017   Iron deficiency anemia due to chronic blood loss    Urticaria    Past Surgical History:   Procedure Laterality Date   CLEFT LIP REPAIR     Otherwise, there have been no changes to her past medical history, surgical history, family history, or social history.  ROS: All others negative except as noted per HPI.   Objective:  There were no vitals taken for this visit. There is no height or weight on file to calculate BMI. Physical Exam: General Appearance:  Alert, cooperative, no distress, appears stated age  Head:  Normocephalic, without obvious abnormality, atraumatic  Eyes:  Conjunctiva clear, EOM's intact  Nose: Nares normal, {Blank multiple:19196:a:"***","hypertrophic turbinates","normal mucosa","no visible anterior polyps","septum midline"}  Throat: Lips, tongue normal; teeth and gums normal, {Blank multiple:19196:a:"***","normal posterior oropharynx","tonsils 2+","tonsils 3+","no tonsillar exudate","+ cobblestoning","surgically absent tonsils"}  Neck: Supple, symmetrical  Lungs:   {Blank multiple:19196:a:"***","clear to auscultation bilaterally","end-expiratory wheezing","wheezing throughout"}, Respirations unlabored, {Blank multiple:19196:a:"***","no coughing","intermittent dry coughing"}  Heart:  {Blank multiple:19196:a:"***","regular rate and rhythm","no murmur"}, Appears well perfused  Extremities: No edema  Skin: {Blank multiple:19196:a:"***","Skin color, texture, turgor normal","no rashes or lesions on visualized portions of skin"}  Neurologic: No gross deficits   Labs: ***  Spirometry:  Tracings reviewed. Her effort: {Blank single:19197::"Good reproducible efforts.","It was hard to get consistent efforts and there is a question as to whether this reflects a maximal maneuver.","Poor effort, data can not be interpreted.","Variable effort-results affected","effort okay for first attempt at spirometry.","Results not reproducible due to ***"} FVC: ***L FEV1: ***L, ***% predicted FEV1/FVC ratio: ***% Interpretation: {Blank single:19197::"Spirometry consistent with mild  obstructive disease","Spirometry consistent with moderate obstructive disease","Spirometry consistent with severe obstructive disease","Spirometry consistent with possible restrictive disease","Spirometry consistent with  mixed obstructive and restrictive disease","Spirometry uninterpretable due to technique","Spirometry consistent with normal pattern","No overt abnormalities noted given today's efforts","Nonobstructive ratio, low FEV1","Nonobstructive ratio, low FEV1, possible restriction"}.  Please see scanned spirometry results for details.  Skin Testing: {Blank single:19197::"Select foods","Environmental allergy panel","Environmental allergy panel and select foods","Food allergy panel","None","Deferred due to recent antihistamines use","deferred due to recent reaction","Pediatric Environmental Allergy Panel","Pediatric Food Panel","Select foods and environmental allergies"}. {Blank single:19197::"Adequate positive and negative controls","Inadequate positive control-testing invalid","Adequate positive and negative controls, dermatographism present, testing difficult to interpret"}. Results discussed with patient/family.   {Blank single:19197::"Allergy testing results were read and interpreted by myself, documented by clinical staff.","Allergy testing results were read by ***,FNP, documented by clinical staff"}  Assessment/Plan   ***  Tonny Bollman, MD  [facility]

## 2022-07-18 ENCOUNTER — Ambulatory Visit: Payer: BC Managed Care – PPO | Admitting: Internal Medicine

## 2022-07-26 ENCOUNTER — Ambulatory Visit: Payer: BC Managed Care – PPO | Admitting: Critical Care Medicine

## 2022-08-07 ENCOUNTER — Encounter: Payer: Medicaid Other | Admitting: Family Medicine

## 2022-09-08 ENCOUNTER — Encounter: Payer: Self-pay | Admitting: Critical Care Medicine

## 2022-09-11 ENCOUNTER — Telehealth: Payer: Self-pay

## 2022-09-11 NOTE — Telephone Encounter (Signed)
Called patient and left voicemail letting her know that the referral requested has been sent

## 2022-09-11 NOTE — Telephone Encounter (Signed)
Order placed let pt know.

## 2022-09-26 ENCOUNTER — Ambulatory Visit (INDEPENDENT_AMBULATORY_CARE_PROVIDER_SITE_OTHER): Payer: PRIVATE HEALTH INSURANCE | Admitting: Family Medicine

## 2022-09-26 ENCOUNTER — Encounter: Payer: Self-pay | Admitting: Family Medicine

## 2022-09-26 VITALS — BP 121/83 | HR 76 | Temp 98.6°F | Ht 70.5 in | Wt 346.0 lb

## 2022-09-26 DIAGNOSIS — F509 Eating disorder, unspecified: Secondary | ICD-10-CM | POA: Diagnosis not present

## 2022-09-26 DIAGNOSIS — Z6841 Body Mass Index (BMI) 40.0 and over, adult: Secondary | ICD-10-CM | POA: Diagnosis not present

## 2022-09-26 DIAGNOSIS — R7303 Prediabetes: Secondary | ICD-10-CM | POA: Diagnosis not present

## 2022-09-26 DIAGNOSIS — Z0289 Encounter for other administrative examinations: Secondary | ICD-10-CM

## 2022-09-26 NOTE — Progress Notes (Signed)
Office: [phone]  /  Fax: [phone]   Initial Visit  Shirley Briggs was seen in clinic today to evaluate for obesity. She is interested in losing weight to improve overall health and reduce the risk of weight related complications. She presents today to review program treatment options, initial physical assessment, and evaluation.     She was referred by: PCP  When asked what else they would like to accomplish? She states: Adopt healthier eating patterns, Improve existing medical conditions, Improve quality of life, and Improve appearance  Weight history: has been overweight since childhood.  Has gained weight in the past year- 60 lb due to family losses.  Works as a Haematologist  When asked how has your weight affected you? She states: Contributed to medical problems, Having fatigue, and Having poor endurance  Some associated conditions: Prediabetes  Contributing factors: Family history, Stress, Reduced physical activity, and Eating patterns  Weight promoting medications identified: None  Current nutrition plan: None  Current level of physical activity: NEAT  Current or previous pharmacotherapy: None  Response to medication: Never tried medications   Past medical history includes:   Past Medical History:  Diagnosis Date   Anemia 11/06/2017   Iron deficiency anemia due to chronic blood loss    Urticaria      Objective:   BP 121/83   Pulse 76   Temp 98.6 F (37 C)   Ht 5' 10.5" (1.791 m)   Wt (!) 346 lb (156.9 kg)   SpO2 98%   BMI 48.94 kg/m  She was weighed on the bioimpedance scale: Body mass index is 48.94 kg/m.  Peak Weight:346 , Body Fat%:52.6, Visceral Fat Rating:16, Weight trend over the last 12 months: Increasing  General:  Alert, oriented and cooperative. Patient is in no acute distress.  Respiratory: Normal respiratory effort, no problems with respiration noted   Gait: able to ambulate independently  Mental Status: Normal mood and  affect. Normal behavior. Normal judgment and thought content.   DIAGNOSTIC DATA REVIEWED:  BMET    Component Value Date/Time   NA 142 03/21/2021 1531   K 5.0 03/21/2021 1531   CL 102 03/21/2021 1531   CO2 23 03/21/2021 1531   GLUCOSE 87 03/21/2021 1531   GLUCOSE 116 (H) 08/17/2018 1657   BUN 8 03/21/2021 1531   CREATININE 0.67 03/21/2021 1531   CALCIUM 9.5 03/21/2021 1531   GFRNONAA >60 08/17/2018 1657   GFRAA >60 08/17/2018 1657   Lab Results  Component Value Date   HGBA1C 6.1 (H) 05/23/2022   HGBA1C 5.6 09/04/2018   No results found for: "INSULIN" CBC    Component Value Date/Time   WBC 8.7 03/21/2021 1531   WBC 9.1 08/17/2018 1657   RBC 4.82 03/21/2021 1531   RBC 4.54 08/17/2018 1657   HGB 12.1 03/21/2021 1531   HCT 37.6 03/21/2021 1531   PLT 289 03/21/2021 1531   MCV 78 (L) 03/21/2021 1531   MCH 25.1 (L) 03/21/2021 1531   MCH 25.3 (L) 08/17/2018 1657   MCHC 32.2 03/21/2021 1531   MCHC 29.9 (L) 08/17/2018 1657   RDW 15.2 03/21/2021 1531   Iron/TIBC/Ferritin/ %Sat    Component Value Date/Time   IRON 67 03/21/2021 1531   TIBC 341 03/21/2021 1531   FERRITIN 30 03/21/2021 1531   IRONPCTSAT 20 03/21/2021 1531   Lipid Panel     Component Value Date/Time   CHOL 162 03/21/2021 1531   TRIG 65 03/21/2021 1531   HDL 43 03/21/2021 1531  CHOLHDL 3.8 03/21/2021 1531   LDLCALC 106 (H) 03/21/2021 1531   Hepatic Function Panel     Component Value Date/Time   PROT 7.3 03/21/2021 1531   ALBUMIN 4.3 03/21/2021 1531   AST 13 03/21/2021 1531   ALT 9 03/21/2021 1531   ALKPHOS 115 03/21/2021 1531   BILITOT 0.3 03/21/2021 1531      Component Value Date/Time   TSH 1.110 03/21/2021 1531     Assessment and Plan:   Prediabetes Assessment & Plan: Lab Results  Component Value Date   HGBA1C 6.1 (H) 05/23/2022   She admits to over - consumption of starches and sweets with a lack of adequate exercise She has never been on metformin She is worried about developing  type II diabetes  Plan to update labs next visit and begin reducing intake of added sugar and starches   Obesity, Class III, BMI 40-49.9 (morbid obesity) (HCC)  BMI 45.0-49.9, adult (HCC)  Eating disorder, unspecified type Assessment & Plan: She has gained weight from life stressors with a hx of emotional eating. She is motivated to gain control over her eating behaviors and improve stress levels.  Consider referral to Dr Dewaine Conger for CBT as part of her treatment plan         Obesity Treatment / Action Plan:  Patient will work on garnering support from family and friends to begin weight loss journey. Will work on eliminating or reducing the presence of highly palatable, calorie dense foods in the home. Will complete provided nutritional and psychosocial assessment questionnaire before the next appointment. Will be scheduled for indirect calorimetry to determine resting energy expenditure in a fasting state.  This will allow [place] to create a reduced calorie, high-protein meal plan to promote loss of fat mass while preserving muscle mass. Will think about ideas on how to incorporate physical activity into their daily routine. Will work on reducing intake of added sugars, simple sugars and processed carbs. Counseled on the health benefits of losing 5%-15% of total body weight. Was counseled on nutritional approaches to weight loss and benefits of reducing processed foods and consuming plant-based foods and high quality protein as part of nutritional weight management. Was counseled on pharmacotherapy and role as an adjunct in weight management.   Obesity Education Performed Today:  She was weighed on the bioimpedance scale and results were discussed and documented in the synopsis.  We discussed obesity as a disease and the importance of a more detailed evaluation of all the factors contributing to the disease.  We discussed the importance of long term lifestyle changes which include  nutrition, exercise and behavioral modifications as well as the importance of customizing this to her specific health and social needs.  We discussed the benefits of reaching a healthier weight to alleviate the symptoms of existing conditions and reduce the risks of the biomechanical, metabolic and psychological effects of obesity.  Shirley Briggs appears to be in the action stage of change and states they are ready to start intensive lifestyle modifications and behavioral modifications.  30 minutes was spent today on this visit including the above counseling, pre-visit chart review, and post-visit documentation.  Reviewed by clinician on day of visit: allergies, medications, problem list, medical history, surgical history, family history, social history, and previous encounter notes pertinent to obesity diagnosis.    Seymour Bars, D.O. DABFM, DABOM [facility] [phone] [place], [place] [address] [phone]

## 2022-09-26 NOTE — Assessment & Plan Note (Signed)
Lab Results  Component Value Date   HGBA1C 6.1 (H) 05/23/2022   She admits to over - consumption of starches and sweets with a lack of adequate exercise She has never been on metformin She is worried about developing type II diabetes  Plan to update labs next visit and begin reducing intake of added sugar and starches

## 2022-09-26 NOTE — Assessment & Plan Note (Signed)
She has gained weight from life stressors with a hx of emotional eating. She is motivated to gain control over her eating behaviors and improve stress levels.  Consider referral to Dr Dewaine Conger for CBT as part of her treatment plan

## 2022-10-10 ENCOUNTER — Ambulatory Visit (HOSPITAL_COMMUNITY)
Admission: RE | Admit: 2022-10-10 | Discharge: 2022-10-10 | Disposition: A | Discharge status: Home / Self Care | Payer: No Typology Code available for payment source | Source: Ambulatory Visit | Attending: Family Medicine | Admitting: Family Medicine

## 2022-10-10 ENCOUNTER — Other Ambulatory Visit: Payer: Self-pay

## 2022-10-10 ENCOUNTER — Ambulatory Visit (INDEPENDENT_AMBULATORY_CARE_PROVIDER_SITE_OTHER): Payer: PRIVATE HEALTH INSURANCE | Admitting: Family Medicine

## 2022-10-10 ENCOUNTER — Encounter: Payer: Self-pay | Admitting: Family Medicine

## 2022-10-10 VITALS — BP 115/77 | HR 77 | Temp 98.2°F | Ht 70.5 in | Wt 343.0 lb

## 2022-10-10 DIAGNOSIS — R0602 Shortness of breath: Secondary | ICD-10-CM

## 2022-10-10 DIAGNOSIS — Z1331 Encounter for screening for depression: Secondary | ICD-10-CM | POA: Diagnosis not present

## 2022-10-10 DIAGNOSIS — F32A Depression, unspecified: Secondary | ICD-10-CM | POA: Insufficient documentation

## 2022-10-10 DIAGNOSIS — R7303 Prediabetes: Secondary | ICD-10-CM | POA: Diagnosis not present

## 2022-10-10 DIAGNOSIS — Z6841 Body Mass Index (BMI) 40.0 and over, adult: Secondary | ICD-10-CM | POA: Insufficient documentation

## 2022-10-10 DIAGNOSIS — R5383 Other fatigue: Secondary | ICD-10-CM

## 2022-10-10 DIAGNOSIS — F3289 Other specified depressive episodes: Secondary | ICD-10-CM

## 2022-10-10 DIAGNOSIS — F5089 Other specified eating disorder: Secondary | ICD-10-CM

## 2022-10-10 DIAGNOSIS — G4733 Obstructive sleep apnea (adult) (pediatric): Secondary | ICD-10-CM

## 2022-10-10 DIAGNOSIS — D508 Other iron deficiency anemias: Secondary | ICD-10-CM

## 2022-10-10 DIAGNOSIS — D649 Anemia, unspecified: Secondary | ICD-10-CM | POA: Insufficient documentation

## 2022-10-10 NOTE — Progress Notes (Signed)
Chief Complaint:   OBESITY Shirley Briggs (MR# [id number]) is a 27 y.o. female who presents for evaluation and treatment of obesity and related comorbidities. Current BMI is Body mass index is 48.52 kg/m. Shirley Briggs has been struggling with her weight for many years and has been unsuccessful in either losing weight, maintaining weight loss, or reaching her healthy weight goal.  Shirley Briggs is currently in the action stage of change and ready to dedicate time achieving and maintaining a healthier weight. Shirley Briggs is interested in becoming our patient and working on intensive lifestyle modifications including (but not limited to) diet and exercise for weight loss.  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  She drinks juice and sweet coffee drinks.  She would like to lose 80 pounds.  She is a boredom and stress eater.  She really eats out, and she craves starches.  She has never been on AOM's.  Shirley Briggs's habits were reviewed today and are as follows: her desired weight loss is 83 lbs, she has been heavy most of her life, she started gaining excessive weight in 2022, her heaviest weight ever was 346 pounds, she is a picky eater and doesn't like to eat healthier foods, she has significant food cravings issues, she snacks frequently in the evenings, she skips meals frequently, she is frequently drinking liquids with calories, she frequently makes poor food choices, she has problems with excessive hunger, she frequently eats larger portions than normal, and she struggles with emotional eating.  Depression Screen Derenda's Food and Mood (modified PHQ-9) score was 13.  Subjective:   1. Other fatigue Shirley Briggs admits to daytime somnolence and admits to waking up still tired. Patient has a history of symptoms of daytime fatigue, morning fatigue, and morning headache. Shirley Briggs generally gets 6 hours of sleep per night, and states that she has nightime awakenings. Snoring is  present. Apneic episodes are not present. Epworth Sleepiness Score is 4.  EKG-normal sinus rhythm at 81 bpm without ischemia.  2. SOBOE (shortness of breath on exertion) Shirley Briggs notes increasing shortness of breath with exercising and seems to be worsening over time with weight gain. She notes getting out of breath sooner with activity than she used to. This has not gotten worse recently. Shirley Briggs denies shortness of breath at rest or orthopnea.  3. Prediabetes Patient's last A1c was 6.1 on 05/23/2022.  She has never had gestational diabetes mellitus, and she has never been on metformin.  She has no family history of type 2 diabetes mellitus.  4. 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  She is not taking oral iron daily.  She complains of fatigue.  Her menses have regulated, and she is not on birth control now.  5. OSA (obstructive sleep apnea) Patient's polysomnogram showed mild obstructive sleep apnea.  She has never been on CPAP.  6. Other depression with emotional eating Patient's bariatric PHQ-9 score is 13.  She skips meals when she is stressed, and she does crave sweets more at night.  Assessment/Plan:   1. Other fatigue Shirley Briggs does feel that her weight is causing her energy to be lower than it should be. Fatigue may be related to obesity, depression or many other causes. Labs will be ordered, and in the meanwhile, Shirley Briggs will focus on self care including making healthy food choices, increasing physical activity and focusing on stress reduction.  - EKG 12-Lead - VITAMIN  D 25 Hydroxy (Vit-D Deficiency, Fractures) - TSH - T4, free - T3 - Lipid panel - Comprehensive metabolic panel - Vitamin B12  2. SOBOE (shortness of breath on exertion) Shirley Briggs does feel that she gets out of breath more easily that she used to when she exercises. Shirley Briggs's shortness of breath appears to be obesity related and exercise induced. She  has agreed to work on weight loss and gradually increase exercise to treat her exercise induced shortness of breath. Will continue to monitor closely.  3. Prediabetes We will check labs today, and we will follow-up at patient's next visit.  - Insulin, random - Hemoglobin A1c  4. Other iron deficiency anemia We will check labs today, we will follow-up at patient's next visit.  - Folate - CBC with Differential/Platelet - Ferritin  5. OSA (obstructive sleep apnea) Patient will begin her active plan for weight loss.  6. Other depression with emotional eating Patient will work on being mindful of her eating and stress reduction.  7. Depression screen Shirley Briggs had a positive depression screening. Depression is commonly associated with obesity and often results in emotional eating behaviors. We will monitor this closely and work on CBT to help improve the non-hunger eating patterns. Referral to Psychology may be required if no improvement is seen as she continues in our clinic.  8. BMI 45.0-49.9, adult (HCC)  9. Morbid obesity with starting BMI 48.5 Shirley Briggs is currently in the action stage of change and her goal is to continue with weight loss efforts. I recommend Shirley Briggs begin the structured treatment plan as follows:  She has agreed to the Category 3 Plan.  100 3 snack list was given.  Discontinue coffee due to itching.  Exercise goals: All adults should avoid inactivity. Some physical activity is better than none, and adults who participate in any amount of physical activity gain some health benefits.   Behavioral modification strategies: increasing lean protein intake, increasing vegetables, increasing water intake, decreasing liquid calories, increasing high fiber foods, decreasing eating out, no skipping meals, meal planning and cooking strategies, keeping healthy foods in the home, ways to avoid boredom eating, better snacking choices, and planning for success.  She was  informed of the importance of frequent follow-up visits to maximize her success with intensive lifestyle modifications for her multiple health conditions. She was informed we would discuss her lab results at her next visit unless there is a critical issue that needs to be addressed sooner. Shirley Briggs agreed to keep her next visit at the agreed upon time to discuss these results.  Objective:   Blood pressure 115/77, pulse 77, temperature 98.2 F (36.8 C), height 5' 10.5" (1.791 m), weight (!) 343 lb (155.6 kg), SpO2 98%. Body mass index is 48.52 kg/m.  EKG: Normal sinus rhythm, rate 81 BPM.  Indirect Calorimeter completed today shows a VO2 of 288 and a REE of 1987.  Her calculated basal metabolic rate is 4098 thus her basal metabolic rate is worse than expected.  General: Cooperative, alert, well developed, in no acute distress. HEENT: Conjunctivae and lids unremarkable. Cardiovascular: Regular rhythm.  Lungs: Normal work of breathing. Neurologic: No focal deficits.   Lab Results  Component Value Date   CREATININE 0.67 03/21/2021   BUN 8 03/21/2021   NA 142 03/21/2021   K 5.0 03/21/2021   CL 102 03/21/2021   CO2 23 03/21/2021   Lab Results  Component Value Date   ALT 9 03/21/2021   AST 13 03/21/2021   ALKPHOS 115 03/21/2021  BILITOT 0.3 03/21/2021   Lab Results  Component Value Date   HGBA1C 6.1 (H) 05/23/2022   HGBA1C 5.6 03/21/2021   HGBA1C 5.9 (H) 09/12/2020   HGBA1C 5.8 (H) 05/26/2020   HGBA1C 5.6 09/04/2018   No results found for: "INSULIN" Lab Results  Component Value Date   TSH 1.110 03/21/2021   Lab Results  Component Value Date   CHOL 162 03/21/2021   HDL 43 03/21/2021   LDLCALC 106 (H) 03/21/2021   TRIG 65 03/21/2021   CHOLHDL 3.8 03/21/2021   Lab Results  Component Value Date   WBC 8.7 03/21/2021   HGB 12.1 03/21/2021   HCT 37.6 03/21/2021   MCV 78 (L) 03/21/2021   PLT 289 03/21/2021   Lab Results  Component Value Date   IRON 67 03/21/2021    TIBC 341 03/21/2021   FERRITIN 30 03/21/2021   Attestation Statements:   Reviewed by clinician on day of visit: allergies, medications, problem list, medical history, surgical history, family history, social history, and previous encounter notes.  Time spent on visit including pre-visit chart review and post-visit charting and care was 40 minutes.   Trude Mcburney, am acting as transcriptionist for Seymour Bars, DO.  I have reviewed the above documentation for accuracy and completeness, and I agree with the above. Seymour Bars DO

## 2022-10-11 LAB — COMPREHENSIVE METABOLIC PANEL
ALT: 10 IU/L (ref 0–32)
AST: 14 IU/L (ref 0–40)
Albumin: 4.1 g/dL (ref 4.0–5.0)
Alkaline Phosphatase: 103 IU/L (ref 44–121)
BUN/Creatinine Ratio: 17 (ref 9–23)
BUN: 12 mg/dL (ref 6–20)
Bilirubin Total: 0.3 mg/dL (ref 0.0–1.2)
CO2: 22 mmol/L (ref 20–29)
Calcium: 9.1 mg/dL (ref 8.7–10.2)
Chloride: 103 mmol/L (ref 96–106)
Creatinine, Ser: 0.69 mg/dL (ref 0.57–1.00)
Globulin, Total: 3.2 g/dL (ref 1.5–4.5)
Glucose: 99 mg/dL (ref 70–99)
Potassium: 4.9 mmol/L (ref 3.5–5.2)
Sodium: 140 mmol/L (ref 134–144)
Total Protein: 7.3 g/dL (ref 6.0–8.5)
eGFR: 123 mL/min/{1.73_m2} (ref >59)

## 2022-10-11 LAB — FOLATE: Folate: 9.3 ng/mL (ref >3.0)

## 2022-10-11 LAB — VITAMIN D 25 HYDROXY (VIT D DEFICIENCY, FRACTURES): Vit D, 25-Hydroxy: 16.3 ng/mL — ABNORMAL LOW (ref 30.0–100.0)

## 2022-10-11 LAB — CBC WITH DIFFERENTIAL/PLATELET
Basophils Absolute: 0 10*3/uL (ref 0.0–0.2)
Basos: 1 %
EOS (ABSOLUTE): 0.1 10*3/uL (ref 0.0–0.4)
Eos: 1 %
Hematocrit: 41.5 % (ref 34.0–46.6)
Hemoglobin: 12.3 g/dL (ref 11.1–15.9)
Immature Grans (Abs): 0 10*3/uL (ref 0.0–0.1)
Immature Granulocytes: 0 %
Lymphocytes Absolute: 2.1 10*3/uL (ref 0.7–3.1)
Lymphs: 24 %
MCH: 23.6 pg — ABNORMAL LOW (ref 26.6–33.0)
MCHC: 29.6 g/dL — ABNORMAL LOW (ref 31.5–35.7)
MCV: 80 fL (ref 79–97)
Monocytes Absolute: 0.5 10*3/uL (ref 0.1–0.9)
Monocytes: 5 %
Neutrophils Absolute: 5.9 10*3/uL (ref 1.4–7.0)
Neutrophils: 69 %
Platelets: 324 10*3/uL (ref 150–450)
RBC: 5.22 x10E6/uL (ref 3.77–5.28)
RDW: 14.5 % (ref 11.7–15.4)
WBC: 8.6 10*3/uL (ref 3.4–10.8)

## 2022-10-11 LAB — VITAMIN B12: Vitamin B-12: 534 pg/mL (ref 232–1245)

## 2022-10-11 LAB — T3: T3, Total: 195 ng/dL — ABNORMAL HIGH (ref 71–180)

## 2022-10-11 LAB — INSULIN, RANDOM: INSULIN: 41 u[IU]/mL — ABNORMAL HIGH (ref 2.6–24.9)

## 2022-10-11 LAB — LIPID PANEL
Chol/HDL Ratio: 4 ratio (ref 0.0–4.4)
Cholesterol, Total: 161 mg/dL (ref 100–199)
HDL: 40 mg/dL (ref >39)
LDL Chol Calc (NIH): 103 mg/dL — ABNORMAL HIGH (ref 0–99)
Triglycerides: 96 mg/dL (ref 0–149)
VLDL Cholesterol Cal: 18 mg/dL (ref 5–40)

## 2022-10-11 LAB — T4, FREE: Free T4: 1.14 ng/dL (ref 0.82–1.77)

## 2022-10-11 LAB — FERRITIN: Ferritin: 21 ng/mL (ref 15–150)

## 2022-10-11 LAB — HEMOGLOBIN A1C
Est. average glucose Bld gHb Est-mCnc: 126 mg/dL
Hgb A1c MFr Bld: 6 % — ABNORMAL HIGH (ref 4.8–5.6)

## 2022-10-11 LAB — TSH: TSH: 1.72 u[IU]/mL (ref 0.450–4.500)

## 2022-10-24 ENCOUNTER — Ambulatory Visit (INDEPENDENT_AMBULATORY_CARE_PROVIDER_SITE_OTHER): Payer: PRIVATE HEALTH INSURANCE | Admitting: Family Medicine

## 2022-10-24 ENCOUNTER — Other Ambulatory Visit: Payer: Self-pay

## 2022-10-24 ENCOUNTER — Encounter: Payer: Self-pay | Admitting: Family Medicine

## 2022-10-24 VITALS — BP 112/80 | HR 83 | Temp 98.3°F | Ht 70.5 in | Wt 345.0 lb

## 2022-10-24 DIAGNOSIS — E611 Iron deficiency: Secondary | ICD-10-CM | POA: Insufficient documentation

## 2022-10-24 DIAGNOSIS — E559 Vitamin D deficiency, unspecified: Secondary | ICD-10-CM | POA: Insufficient documentation

## 2022-10-24 DIAGNOSIS — E66813 Obesity, class 3: Secondary | ICD-10-CM

## 2022-10-24 DIAGNOSIS — R7303 Prediabetes: Secondary | ICD-10-CM | POA: Diagnosis not present

## 2022-10-24 DIAGNOSIS — R7989 Other specified abnormal findings of blood chemistry: Secondary | ICD-10-CM | POA: Insufficient documentation

## 2022-10-24 DIAGNOSIS — F509 Eating disorder, unspecified: Secondary | ICD-10-CM | POA: Diagnosis not present

## 2022-10-24 DIAGNOSIS — Z6841 Body Mass Index (BMI) 40.0 and over, adult: Secondary | ICD-10-CM

## 2022-10-24 DIAGNOSIS — E88819 Insulin resistance, unspecified: Secondary | ICD-10-CM | POA: Insufficient documentation

## 2022-10-24 DIAGNOSIS — E662 Morbid (severe) obesity with alveolar hypoventilation: Secondary | ICD-10-CM

## 2022-10-24 MED ORDER — VITAMIN D (ERGOCALCIFEROL) 1.25 MG (50000 UNIT) PO CAPS
50000.0000 [IU] | ORAL_CAPSULE | ORAL | 0 refills | Status: DC
Start: 2022-10-24 — End: 2022-11-14
  Filled 2022-10-24: qty 5, 35d supply, fill #0

## 2022-10-24 MED ORDER — METFORMIN HCL 500 MG PO TABS
500.0000 mg | ORAL_TABLET | Freq: Every day | ORAL | 0 refills | Status: DC
Start: 2022-10-24 — End: 2022-11-14
  Filled 2022-10-24: qty 30, 30d supply, fill #0

## 2022-10-24 MED ORDER — BUPROPION HCL ER (SR) 150 MG PO TB12
150.0000 mg | ORAL_TABLET | Freq: Every day | ORAL | 0 refills | Status: DC
Start: 2022-10-24 — End: 2022-11-14
  Filled 2022-10-24: qty 30, 30d supply, fill #0

## 2022-10-24 NOTE — Assessment & Plan Note (Signed)
New.  Reviewed lab results with patient.  Her T3 was slightly abnormal but her TSH and free T4 were normal.  She does complain of feeling hot at times.  She denies heart palpitations, diarrhea or rapid weight loss.  Recheck thyroid panel next visit.

## 2022-10-24 NOTE — Assessment & Plan Note (Signed)
Patient gained a tremendous amount of weight with emotional stress and currently has job stress which has been impacting her food choices and appetite.  Begin Wellbutrin SR 150 mg once daily.  Consider adding on cognitive behavioral therapy.  Continue working on stress reduction, adequate sleep and good nutrition.

## 2022-10-24 NOTE — Assessment & Plan Note (Signed)
Reviewed lab results with patient.  Fasting insulin very high at 41.  This explains her hyperphagia, carb and sugar cravings and difficulty losing weight.  She has started cutting out sugar sweetened beverages.  Continue working on increasing walking time.  Continue prescribed dietary plan.  Begin metformin 500 mg once daily with food

## 2022-10-24 NOTE — Progress Notes (Signed)
Office: [phone]  /  Fax: [phone]  WEIGHT SUMMARY AND BIOMETRICS  Starting Date: 10/10/22  Starting Weight: 343lb   Weight Lost Since Last Visit: 0lb   Vitals Temp: 98.3 F (36.8 C) BP: 112/80 Pulse Rate: 83 SpO2: 96 %   Body Composition  Body Fat %: 52.8 % Fat Mass (lbs): 182.2 lbs Muscle Mass (lbs): 154.6 lbs Total Body Water (lbs): 115.2 lbs Visceral Fat Rating : 16   HPI  Chief Complaint: OBESITY  Shirley Briggs is here to discuss her progress with her obesity treatment plan. She is on the the Category 2 Plan and states she is following her eating plan approximately 60 % of the time. She states she is exercising 0 minutes 0 times per week.   Interval History:  Since last office visit she is up 2 lb She has been working more and has skipped some meals She gets up at 8 am, at work by 9:30 am, works until 7:30 pm She is usually waiting to eat until 11 am, coffee before this She has been picking fruit bowls around 11 am, salami, cheese and cheese She may have candy or chips in between lunch and dinner She did cut out SSBs and has increased water intake.  Craves soda on her menses Dinner is at home around 8-9 pm.  She is cooking something quick at home.   She is lacking protein with her dinner.    Pharmacotherapy: none  PHYSICAL EXAM:  Blood pressure 112/80, pulse 83, temperature 98.3 F (36.8 C), height 5' 10.5" (1.791 m), weight (!) 345 lb (156.5 kg), SpO2 96%. Body mass index is 48.8 kg/m.  General: She is overweight, cooperative, alert, well developed, and in no acute distress. PSYCH: Has normal mood, affect and thought process.   Lungs: Normal breathing effort, no conversational dyspnea.   ASSESSMENT AND PLAN  TREATMENT PLAN FOR OBESITY:  Recommended Dietary Goals  [name] is currently in the action stage of change. As such, her goal is to continue weight management plan. She has agreed to following a lower carbohydrate, vegetable and lean  protein rich diet plan.  Behavioral Intervention  We discussed the following Behavioral Modification Strategies today: increasing lean protein intake to established goals, increasing vegetables, increasing fiber rich foods, avoiding skipping meals, work on meal planning and preparation, keeping healthy foods at home, identifying sources and decreasing liquid calories, planning for success, better snacking choices, and continue to work on maintaining a reduced calorie state, getting the recommended amount of protein, incorporating whole foods, making healthy choices, staying well hydrated and practicing mindfulness when eating..  Additional resources provided today: NA  Recommended Physical Activity Goals  Rolando has been advised to work up to 150 minutes of moderate intensity aerobic activity a week and strengthening exercises 2-3 times per week for cardiovascular health, weight loss maintenance and preservation of muscle mass.   She has agreed to Think about enjoyable ways to increase daily physical activity and overcoming barriers to exercise and Increase physical activity in their day and reduce sedentary time (increase NEAT).  Pharmacotherapy changes for the treatment of obesity: begin metformin 500 mg once daily + Wellbutrin SR 150 mg daily  ASSOCIATED CONDITIONS ADDRESSED TODAY  Vitamin D deficiency Assessment & Plan: Last vitamin D Lab Results  Component Value Date   VD25OH 16.3 (L) 10/10/2022  New Reviewed labs with patient.  She does complain of fatigue.  She is currently not on a vitamin D supplement.  We discussed problems with vitamin D  deficiency including fatigue, bone loss and poor immune function.  We discussed a goal over 50.  Begin vitamin D 50,000 IU once weekly.  Recheck level in 3 to 4 months  Orders: -     Vitamin D (Ergocalciferol); Take 1 capsule (50,000 Units total) by mouth every 7 (seven) days.  Dispense: 5 capsule; Refill: 0  Prediabetes Assessment &  Plan: Lab Results  Component Value Date   HGBA1C 6.0 (H) 10/10/2022   Stable.  Reviewed lab results with patient.  She is at risk for type 2 diabetes with a BMI of 48 and a high visceral fat rating. She has been consuming excess starches and sweets without regular exercise.  She has never taken metformin.  With her high level of insulin resistance and prediabetes and a BMI of 48, begin metformin 500 mg once daily with food.  We discussed potential adverse side effects.  Continue working on reducing added sugar and refined carbohydrates.  Continue active plan for weight reduction.  Orders: -     metFORMIN HCl; Take 1 tablet (500 mg total) by mouth daily with breakfast.  Dispense: 30 tablet; Refill: 0  Eating disorder, unspecified type Assessment & Plan: Patient gained a tremendous amount of weight with emotional stress and currently has job stress which has been impacting her food choices and appetite.  Begin Wellbutrin SR 150 mg once daily.  Consider adding on cognitive behavioral therapy.  Continue working on stress reduction, adequate sleep and good nutrition.   Orders: -     buPROPion HCl ER (SR); Take 1 tablet (150 mg total) by mouth daily.  Dispense: 30 tablet; Refill: 0  Class 3 obesity with alveolar hypoventilation, serious comorbidity, and body mass index (BMI) of 45.0 to 49.9 in adult [facility])  Iron deficiency Assessment & Plan: Patient notes 2 days of heavy menses at the start of her cycle.  She is currently not on a daily iron supplement but takes it for the 2 days at the start of her cycle.  Her ferritin level is in the low normal range with a low MCHC.  Begin a women's multivitamin that has iron in it once daily.   Abnormal thyroid blood test Assessment & Plan: New.  Reviewed lab results with patient.  Her T3 was slightly abnormal but her TSH and free T4 were normal.  She does complain of feeling hot at times.  She denies heart palpitations, diarrhea or rapid weight  loss.  Recheck thyroid panel next visit.   Insulin resistance Assessment & Plan: Reviewed lab results with patient.  Fasting insulin very high at 41.  This explains her hyperphagia, carb and sugar cravings and difficulty losing weight.  She has started cutting out sugar sweetened beverages.  Continue working on increasing walking time.  Continue prescribed dietary plan.  Begin metformin 500 mg once daily with food        She was informed of the importance of frequent follow up visits to maximize her success with intensive lifestyle modifications for her multiple health conditions.   ATTESTASTION STATEMENTS:  Reviewed by clinician on day of visit: allergies, medications, problem list, medical history, surgical history, family history, social history, and previous encounter notes pertinent to obesity diagnosis.   I have personally spent 30 minutes total time today in preparation, patient care, nutritional counseling and documentation for this visit, including the following: review of clinical lab tests; review of medical tests/procedures/services.      Glennis Brink, DO DABFM, DABOM [facility] [address] [place], [place] [address] [phone]

## 2022-10-24 NOTE — Assessment & Plan Note (Signed)
Patient notes 2 days of heavy menses at the start of her cycle.  She is currently not on a daily iron supplement but takes it for the 2 days at the start of her cycle.  Her ferritin level is in the low normal range with a low MCHC.  Begin a women's multivitamin that has iron in it once daily.

## 2022-10-24 NOTE — Assessment & Plan Note (Signed)
Last vitamin D Lab Results  Component Value Date   VD25OH 16.3 (L) 10/10/2022  New Reviewed labs with patient.  She does complain of fatigue.  She is currently not on a vitamin D supplement.  We discussed problems with vitamin D deficiency including fatigue, bone loss and poor immune function.  We discussed a goal over 50.  Begin vitamin D 50,000 IU once weekly.  Recheck level in 3 to 4 months

## 2022-10-24 NOTE — Assessment & Plan Note (Signed)
Lab Results  Component Value Date   HGBA1C 6.0 (H) 10/10/2022   Stable.  Reviewed lab results with patient.  She is at risk for type 2 diabetes with a BMI of 48 and a high visceral fat rating. She has been consuming excess starches and sweets without regular exercise.  She has never taken metformin.  With her high level of insulin resistance and prediabetes and a BMI of 48, begin metformin 500 mg once daily with food.  We discussed potential adverse side effects.  Continue working on reducing added sugar and refined carbohydrates.  Continue active plan for weight reduction.

## 2022-10-24 NOTE — Patient Instructions (Signed)
Start metformin 500 mg once daily with food for a week If doing OK on it, you can then add Wellbutrin SR 150 mg each morning  Take both of these medications daily Begin RX vitamin D once a week  1/4 plate CARB with dinner OK (try to pick higher fiber items) Swap out salami for [place] or chicken ([employer] packs or P3 packs) You can pick a protein shake for breakfast- - [employer], [employer], OWYN  On late work nights: Healthy Choice Meals Prebagged salads (add a protein) Health visitor Protein pasta  HOLD THE COFFEE RECHECK THYROID NEXT VISIT

## 2022-11-14 ENCOUNTER — Other Ambulatory Visit: Payer: Self-pay

## 2022-11-14 ENCOUNTER — Ambulatory Visit (INDEPENDENT_AMBULATORY_CARE_PROVIDER_SITE_OTHER): Payer: PRIVATE HEALTH INSURANCE | Admitting: Family Medicine

## 2022-11-14 ENCOUNTER — Encounter: Payer: Self-pay | Admitting: Family Medicine

## 2022-11-14 VITALS — BP 122/80 | HR 77 | Temp 98.3°F | Ht 70.5 in | Wt 342.0 lb

## 2022-11-14 DIAGNOSIS — E559 Vitamin D deficiency, unspecified: Secondary | ICD-10-CM | POA: Diagnosis not present

## 2022-11-14 DIAGNOSIS — F509 Eating disorder, unspecified: Secondary | ICD-10-CM

## 2022-11-14 DIAGNOSIS — R7303 Prediabetes: Secondary | ICD-10-CM | POA: Diagnosis not present

## 2022-11-14 DIAGNOSIS — R7989 Other specified abnormal findings of blood chemistry: Secondary | ICD-10-CM

## 2022-11-14 DIAGNOSIS — E66813 Obesity, class 3: Secondary | ICD-10-CM

## 2022-11-14 DIAGNOSIS — Z6841 Body Mass Index (BMI) 40.0 and over, adult: Secondary | ICD-10-CM

## 2022-11-14 MED ORDER — BUPROPION HCL ER (SR) 150 MG PO TB12
150.0000 mg | ORAL_TABLET | Freq: Every day | ORAL | 0 refills | Status: DC
  Filled 2022-11-14: qty 30, 30d supply, fill #0

## 2022-11-14 MED ORDER — VITAMIN D (ERGOCALCIFEROL) 1.25 MG (50000 UNIT) PO CAPS
50000.0000 [IU] | ORAL_CAPSULE | ORAL | 0 refills | Status: DC
  Filled 2022-11-14: qty 12, 84d supply, fill #0

## 2022-11-14 MED ORDER — METFORMIN HCL 500 MG PO TABS
500.0000 mg | ORAL_TABLET | Freq: Every day | ORAL | 0 refills | Status: DC
Start: 2022-11-14 — End: 2022-12-18
  Filled 2022-11-14: qty 30, 30d supply, fill #0

## 2022-11-14 NOTE — Assessment & Plan Note (Signed)
Lab Results  Component Value Date   HGBA1C 6.0 (H) 10/10/2022   Doing well on metformin 500 mg once daily with food.  Had some loose stools with first starting. She is working on prescribed meal plan, reducing sugar and starches.  She is walking while at work ~10 min most days of the week  Continue metformin 500 mg once daily with food.  Reminded her to avoid greasy/ rich/ sweet foods while on this due to GI disturbance SE.  Repeat labs in 3 mos.  Increase walking time and continue prescribed diet.

## 2022-11-14 NOTE — Assessment & Plan Note (Signed)
Last vitamin D Lab Results  Component Value Date   VD25OH 16.3 (L) 10/10/2022   She is doing well on RX vitamin D weekly Energy level starting to improve  Repeat lab in 3 mos

## 2022-11-14 NOTE — Assessment & Plan Note (Signed)
Pt remains asymptomatic and has never had thyroid disorder Her last T3 was abnormal.  Repeat labs today

## 2022-11-14 NOTE — Progress Notes (Signed)
Office: [phone]  /  Fax: [phone]  WEIGHT SUMMARY AND BIOMETRICS  Starting Date: 10/10/22  Starting Weight: 343lb   Weight Lost Since Last Visit: 3lb   Vitals Temp: 98.3 F (36.8 C) BP: 122/80 Pulse Rate: 77 SpO2: 98 %   Body Composition  Body Fat %: 52.6 % Fat Mass (lbs): 180.2 lbs Muscle Mass (lbs): 154.2 lbs Total Body Water (lbs): 114.8 lbs Visceral Fat Rating : 16     HPI  Chief Complaint: OBESITY  Shirley Briggs is here to discuss her progress with her obesity treatment plan. She is on the the Category 3 Plan and states she is following her eating plan approximately 75 % of the time. She states she is trying to walk more on a daily basis.    Interval History:  Since last office visit she is down 3 lb She has some travel and celebrations She has upcoming social events upcoming She has been working on meal prep during the week Emotional eating and cravings have improved on buproprion SR 150 mg daily Sleeping better at night Taking metformin 500 mg once a day She is off of SSBs She is doing well bringing food to work She has a good support system at home She plans to start wearing her Apple watch and increase walking time  Pharmacotherapy: Buproprion SR 150 mg daily + Metformin 500 mg daily  PHYSICAL EXAM:  Blood pressure 122/80, pulse 77, temperature 98.3 F (36.8 C), height 5' 10.5" (1.791 m), weight (!) 342 lb (155.1 kg), SpO2 98%. Body mass index is 48.38 kg/m.  General: She is overweight, cooperative, alert, well developed, and in no acute distress. PSYCH: Has normal mood, affect and thought process.   Lungs: Normal breathing effort, no conversational dyspnea.   ASSESSMENT AND PLAN  TREATMENT PLAN FOR OBESITY:  Recommended Dietary Goals  Shirley Briggs is currently in the action stage of change. As such, her goal is to continue weight management plan. She has agreed to the Category 3 Plan.  Behavioral Intervention  We discussed the  following Behavioral Modification Strategies today: continue to work on maintaining a reduced calorie state, getting the recommended amount of protein, incorporating whole foods, making healthy choices, staying well hydrated and practicing mindfulness when eating..  Additional resources provided today: NA  Recommended Physical Activity Goals  Shirley Briggs has been advised to work up to 150 minutes of moderate intensity aerobic activity a week and strengthening exercises 2-3 times per week for cardiovascular health, weight loss maintenance and preservation of muscle mass.   She has agreed to Think about enjoyable ways to increase daily physical activity and overcoming barriers to exercise and Increase physical activity in their day and reduce sedentary time (increase NEAT).  Pharmacotherapy changes for the treatment of obesity: none  ASSOCIATED CONDITIONS ADDRESSED TODAY  Abnormal thyroid blood test Assessment & Plan: Pt remains asymptomatic and has never had thyroid disorder Her last T3 was abnormal.  Repeat labs today  Orders: -     Thyroid Panel With TSH  Eating disorder, unspecified type Assessment & Plan: Improved emotional eating and food cravings with use of Buproprion SR 150 mg daily Denies sleep problems or irritability Avoiding pregnancy while on this medication.  Plan: continue Buproprion SR 150 mg daily.  Consider increase to bid next visit.  We reviewed the 'all or none' mentality with social events, celebrations, etc.  Will continue to focus on lean protein and fiber even when eating out.  Recommend a high protein snack prior to attending  a social event and she agrees to checking out [employer] ahead of time to make better choices.  Orders: -     buPROPion HCl ER (SR); Take 1 tablet (150 mg total) by mouth daily.  Dispense: 30 tablet; Refill: 0  Vitamin D deficiency Assessment & Plan: Last vitamin D Lab Results  Component Value Date   VD25OH 16.3 (L) 10/10/2022    She is doing well on RX vitamin D weekly Energy level starting to improve  Repeat lab in 3 mos  Orders: -     Vitamin D (Ergocalciferol); Take 1 capsule (50,000 Units total) by mouth every 7 (seven) days.  Dispense: 12 capsule; Refill: 0  Prediabetes Assessment & Plan: Lab Results  Component Value Date   HGBA1C 6.0 (H) 10/10/2022   Doing well on metformin 500 mg once daily with food.  Had some loose stools with first starting. She is working on prescribed meal plan, reducing sugar and starches.  She is walking while at work ~10 min most days of the week  Continue metformin 500 mg once daily with food.  Reminded her to avoid greasy/ rich/ sweet foods while on this due to GI disturbance SE.  Repeat labs in 3 mos.  Increase walking time and continue prescribed diet.  Orders: -     metFORMIN HCl; Take 1 tablet (500 mg total) by mouth daily with breakfast.  Dispense: 30 tablet; Refill: 0 -     Basic metabolic panel  Class 3 severe obesity due to excess calories with serious comorbidity and body mass index (BMI) of 45.0 to 49.9 in adult [facility])      She was informed of the importance of frequent follow up visits to maximize her success with intensive lifestyle modifications for her multiple health conditions.   ATTESTASTION STATEMENTS:  Reviewed by clinician on day of visit: allergies, medications, problem list, medical history, surgical history, family history, social history, and previous encounter notes pertinent to obesity diagnosis.   I have personally spent 30 minutes total time today in preparation, patient care, nutritional counseling and documentation for this visit, including the following: review of clinical lab tests; review of medical tests/procedures/services.      Glennis Brink, DO DABFM, DABOM [facility] [address] [place], [place] [address] [phone]

## 2022-11-14 NOTE — Patient Instructions (Signed)
Increase water intake to 100 oz/ day Add in one packet of sugar free electrolytes Vitamin Water ZERO [employer] Body [employer]our Zero or Lyte GZERO  Stay on current medicines Increase walking time to 20 min per day Start wearing smart watch  Stay mindful when eating out/ socializing Prioritize lean protein and fiber  Try out a protein bar  Keep this <200 cal and < 8 g of sugar

## 2022-11-14 NOTE — Assessment & Plan Note (Signed)
Improved emotional eating and food cravings with use of Buproprion SR 150 mg daily Denies sleep problems or irritability Avoiding pregnancy while on this medication.  Plan: continue Buproprion SR 150 mg daily.  Consider increase to bid next visit.  We reviewed the 'all or none' mentality with social events, celebrations, etc.  Will continue to focus on lean protein and fiber even when eating out.  Recommend a high protein snack prior to attending a social event and she agrees to checking out [employer] ahead of time to make better choices.

## 2022-11-15 ENCOUNTER — Other Ambulatory Visit: Payer: Self-pay

## 2022-11-15 LAB — BASIC METABOLIC PANEL
BUN/Creatinine Ratio: 16 (ref 9–23)
BUN: 9 mg/dL (ref 6–20)
CO2: 23 mmol/L (ref 20–29)
Calcium: 9.2 mg/dL (ref 8.7–10.2)
Chloride: 103 mmol/L (ref 96–106)
Creatinine, Ser: 0.58 mg/dL (ref 0.57–1.00)
Glucose: 97 mg/dL (ref 70–99)
Potassium: 5 mmol/L (ref 3.5–5.2)
Sodium: 139 mmol/L (ref 134–144)
eGFR: 128 mL/min/{1.73_m2} (ref >59)

## 2022-11-15 LAB — THYROID PANEL WITH TSH
Free Thyroxine Index: 1.8 (ref 1.2–4.9)
T3 Uptake Ratio: 22 % — ABNORMAL LOW (ref 24–39)
T4, Total: 8.4 ug/dL (ref 4.5–12.0)
TSH: 1.13 u[IU]/mL (ref 0.450–4.500)

## 2022-11-22 ENCOUNTER — Ambulatory Visit
Admission: EM | Admit: 2022-11-22 | Discharge: 2022-11-22 | Disposition: A | Discharge status: Home / Self Care | Payer: PRIVATE HEALTH INSURANCE | Attending: Internal Medicine | Admitting: Internal Medicine

## 2022-11-22 ENCOUNTER — Other Ambulatory Visit: Payer: Self-pay

## 2022-11-22 DIAGNOSIS — H02849 Edema of unspecified eye, unspecified eyelid: Secondary | ICD-10-CM | POA: Diagnosis not present

## 2022-11-22 MED ORDER — DEXAMETHASONE SODIUM PHOSPHATE 10 MG/ML IJ SOLN
10.0000 mg | Freq: Once | INTRAMUSCULAR | Status: AC
  Administered 2022-11-22: 10 mg via INTRAMUSCULAR

## 2022-11-22 NOTE — ED Triage Notes (Signed)
Patient presents with bilateral bottom of eyes itching that started on Tuesday, states when she woke up, they were both swollen.

## 2022-11-22 NOTE — ED Provider Notes (Signed)
[facility]    CSN: [id number] Arrival date & time: 11/22/22  0841      History   Chief Complaint Chief Complaint  Patient presents with   Facial Swelling    HPI Shirley Briggs is a 27 y.o. female.   Patient presents with eyelid swelling and itchiness that started about 2 to 3 days ago.  Reports that her lower eyelids and left upper eyelid have been swelling.  Denies trauma or foreign body to the eyes.  Denies blurry vision.  Denies any drainage from the area.  She does have a history of chronic allergies where she is seen by an allergy specialist.  She takes a daily Zyrtec.  She previously took Pepcid and hydroxyzine but no longer takes these medications.  Patient is not reporting any feelings of throat closing or shortness of breath.  Denies any changes to the environment.     Past Medical History:  Diagnosis Date   Anemia 11/06/2017   Anxiety    Depression    Iron deficiency anemia due to chronic blood loss    Pre-diabetes    Shortness of breath    Sleep apnea    Urticaria     Patient Active Problem List   Diagnosis Date Noted   Abnormal thyroid blood test 10/24/2022   Iron deficiency 10/24/2022   Insulin resistance 10/24/2022   Vitamin D deficiency 10/24/2022   Other fatigue 10/10/2022   SOBOE (shortness of breath on exertion) 10/10/2022   Absolute anemia 10/10/2022   OSA (obstructive sleep apnea) 10/10/2022   Depression 10/10/2022   Depression screen 10/10/2022   BMI 45.0-49.9, adult (HCC) 10/10/2022   Eating disorder 09/26/2022   Full body hives 07/11/2021   Cervical cancer screening 03/21/2021   Prediabetes 03/21/2021   Morbid obesity (HCC) 09/04/2018    Past Surgical History:  Procedure Laterality Date   CLEFT LIP REPAIR      OB History     Gravida  0   Para  0   Term  0   Preterm  0   AB  0   Living  0      SAB  0   IAB  0   Ectopic  0   Multiple  0   Live Births  0            Home Medications     Prior to Admission medications   Medication Sig Start Date End Date Taking? Authorizing Provider  buPROPion (WELLBUTRIN SR) 150 MG 12 hr tablet Take 1 tablet (150 mg total) by mouth daily. 11/14/22   Bowen, Scot Jun, DO  cetirizine (ZYRTEC) 10 MG tablet Take 1 tablet (10 mg total) by mouth daily. 05/23/22   Storm Frisk, MD  famotidine (PEPCID) 20 MG tablet Take 1 tablet (20 mg total) by mouth daily. 05/23/22   Storm Frisk, MD  hydrOXYzine (ATARAX) 25 MG tablet Take 1 tablet (25 mg total) by mouth 3 (three) times daily as needed for itching. 05/23/22   Storm Frisk, MD  metFORMIN (GLUCOPHAGE) 500 MG tablet Take 1 tablet (500 mg total) by mouth daily with breakfast. 11/14/22   Bowen, Scot Jun, DO  Vitamin D, Ergocalciferol, (DRISDOL) 1.25 MG (50000 UNIT) CAPS capsule Take 1 capsule (50,000 Units total) by mouth every 7 (seven) days. 11/14/22   Bowen, Scot Jun, DO    Family History Family History  Problem Relation Age of Onset   Cancer Mother    Hyperlipidemia Mother  Hypertension Mother    Anemia Mother    Anxiety disorder Mother    Depression Mother    Bipolar disorder Mother    Liver disease Mother    Alcohol abuse Mother    Drug abuse Mother    Obesity Father    Allergic rhinitis Sister    Anemia Maternal Grandmother    Heart disease Maternal Grandmother    Sickle cell anemia Neg Hx    Sickle cell trait Neg Hx     Social History Social History   Tobacco Use   Smoking status: Never    Passive exposure: Current   Smokeless tobacco: Never  Vaping Use   Vaping status: Never Used  Substance Use Topics   Alcohol use: Yes   Drug use: Never     Allergies   Patient has no known allergies.   Review of Systems Review of Systems Per HPI  Physical Exam Triage Vital Signs ED Triage Vitals  Encounter Vitals Group     BP 11/22/22 0922 113/79     Systolic BP Percentile --      Diastolic BP Percentile --      Pulse Rate 11/22/22 0922 78     Resp 11/22/22  0922 18     Temp 11/22/22 0922 98 F (36.7 C)     Temp Source 11/22/22 0922 Oral     SpO2 11/22/22 0922 100 %     Weight 11/22/22 0920 (!) 343 lb (155.6 kg)     Height 11/22/22 0920 5\' 11"  (1.803 m)     Head Circumference --      Peak Flow --      Pain Score 11/22/22 0920 2     Pain Loc --      Pain Education --      Exclude from Growth Chart --    No data found.  Updated Vital Signs BP 113/79 (BP Location: Left Wrist)   Pulse 78   Temp 98 F (36.7 C) (Oral)   Resp 18   Ht 5\' 11"  (1.803 m)   Wt (!) 343 lb (155.6 kg)   LMP 11/03/2022   SpO2 100%   BMI 47.84 kg/m   Visual Acuity Right Eye Distance:   Left Eye Distance:   Bilateral Distance:    Right Eye Near:   Left Eye Near:    Bilateral Near:     Physical Exam Constitutional:      General: She is not in acute distress.    Appearance: Normal appearance. She is not toxic-appearing or diaphoretic.  HENT:     Head: Normocephalic and atraumatic.  Eyes:     General: Lids are everted, no foreign bodies appreciated. Vision grossly intact. Gaze aligned appropriately.     Extraocular Movements: Extraocular movements intact.     Conjunctiva/sclera: Conjunctivae normal.     Pupils: Pupils are equal, round, and reactive to light.     Comments: Mild bilateral lower eyelid swelling.  Mild left upper eyelid swelling.  No discoloration noted.  Pulmonary:     Effort: Pulmonary effort is normal.  Neurological:     General: No focal deficit present.     Mental Status: She is alert and oriented to person, place, and time. Mental status is at baseline.  Psychiatric:        Mood and Affect: Mood normal.        Behavior: Behavior normal.        Thought Content: Thought content normal.  Judgment: Judgment normal.      UC Treatments / Results  Labs (all labs ordered are listed, but only abnormal results are displayed) Labs Reviewed - No data to display  EKG   Radiology No results found.  Procedures Procedures  (including critical care time)  Medications Ordered in UC Medications  dexamethasone (DECADRON) injection 10 mg (has no administration in time range)    Initial Impression / Assessment and Plan / UC Course  I have reviewed the triage vital signs and the nursing notes.  Pertinent labs & imaging results that were available during my care of the patient were reviewed by me and considered in my medical decision making (see chart for details).     Suspect allergy related symptoms.  Patient already taking antihistamine which has not been beneficial.  Will treat with IM Decadron today.  No obvious contraindications to steroid therapy noted in patient's history.  Advised strict follow-up if any symptoms persist or worsen.  Patient verbalized understanding and was agreeable with plan. Final Clinical Impressions(s) / UC Diagnoses   Final diagnoses:  Swelling of eyelid, unspecified laterality     Discharge Instructions      You were given a steroid shot today in urgent care to help alleviate suspected allergic reaction causing your eyelid swelling and itching.  Continue Zyrtec.  Follow-up if any symptoms persist or worsen.    ED Prescriptions   None    PDMP not reviewed this encounter.   Gustavus Bryant, [place] 11/22/22 1006

## 2022-11-22 NOTE — Discharge Instructions (Addendum)
You were given a steroid shot today in urgent care to help alleviate suspected allergic reaction causing your eyelid swelling and itching.  Continue Zyrtec.  Follow-up if any symptoms persist or worsen.

## 2022-12-12 ENCOUNTER — Ambulatory Visit: Payer: PRIVATE HEALTH INSURANCE | Admitting: Family Medicine

## 2022-12-18 ENCOUNTER — Other Ambulatory Visit: Payer: Self-pay

## 2022-12-18 ENCOUNTER — Ambulatory Visit (INDEPENDENT_AMBULATORY_CARE_PROVIDER_SITE_OTHER): Payer: Self-pay | Admitting: Family Medicine

## 2022-12-18 ENCOUNTER — Encounter: Payer: Self-pay | Admitting: Family Medicine

## 2022-12-18 VITALS — BP 109/65 | HR 82 | Temp 97.6°F | Ht 70.5 in | Wt 341.0 lb

## 2022-12-18 DIAGNOSIS — E559 Vitamin D deficiency, unspecified: Secondary | ICD-10-CM

## 2022-12-18 DIAGNOSIS — F509 Eating disorder, unspecified: Secondary | ICD-10-CM

## 2022-12-18 DIAGNOSIS — R7303 Prediabetes: Secondary | ICD-10-CM | POA: Diagnosis not present

## 2022-12-18 DIAGNOSIS — Z6841 Body Mass Index (BMI) 40.0 and over, adult: Secondary | ICD-10-CM

## 2022-12-18 DIAGNOSIS — E66813 Obesity, class 3: Secondary | ICD-10-CM

## 2022-12-18 DIAGNOSIS — R7989 Other specified abnormal findings of blood chemistry: Secondary | ICD-10-CM

## 2022-12-18 MED ORDER — METFORMIN HCL 500 MG PO TABS
500.0000 mg | ORAL_TABLET | Freq: Two times a day (BID) | ORAL | 1 refills | Status: DC
  Filled 2022-12-18 – 2022-12-27 (×2): qty 60, 30d supply, fill #0

## 2022-12-18 MED ORDER — VITAMIN D (ERGOCALCIFEROL) 1.25 MG (50000 UNIT) PO CAPS
50000.0000 [IU] | ORAL_CAPSULE | ORAL | 1 refills | Status: DC
  Filled 2022-12-18 – 2022-12-27 (×2): qty 5, 35d supply, fill #0

## 2022-12-18 MED ORDER — BUPROPION HCL ER (SR) 150 MG PO TB12
150.0000 mg | ORAL_TABLET | Freq: Every day | ORAL | 1 refills | Status: DC
  Filled 2022-12-18 – 2022-12-27 (×2): qty 30, 30d supply, fill #0

## 2022-12-18 NOTE — Assessment & Plan Note (Signed)
Lab Results  Component Value Date   HGBA1C 6.0 (H) 10/10/2022   Shirley Briggs has done well on metformin 500 mg once daily with food.  Reviewed labs from last visit.  Chemistry panel stable.  Shirley Briggs is working on her prescribed dietary plan but has been fairly nonadherent due to travel and celebrations.  Shirley Briggs is more mindful of her intake of sweets.  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  Continue working on prescribed dietary plan.  Will be working on increasing number of steps and tracking on a consistent basis.  Increase metformin to 500 mg twice daily, taking 1 tab with breakfast and 1 tab with dinner daily.

## 2022-12-18 NOTE — Assessment & Plan Note (Signed)
Disordered eating due to emotional eating has improved somewhat on bupropion SR 150 mg once daily.  She denies adverse side effects.  She has a good support system.  Stress levels are stable.  She is working on improving sleep at night and practicing mindful eating.  Consider increasing bupropion dose next visit and/or adding CBT with Dr. Dewaine Conger

## 2022-12-18 NOTE — Assessment & Plan Note (Signed)
Reviewed labs from last visit.  TSH and free T4 both normal.  Energy levels are stable.  Will continue to follow.  No further workup needed.

## 2022-12-18 NOTE — Progress Notes (Signed)
Office: [phone]  /  Fax: [phone]  WEIGHT SUMMARY AND BIOMETRICS  Starting Date: 10/10/22  Starting Weight: 343lb   Weight Lost Since Last Visit: 1lb   Vitals Temp: 97.6 F (36.4 C) BP: 109/65 Pulse Rate: 82 SpO2: 94 %   Body Composition  Body Fat %: 52.4 % Fat Mass (lbs): 179 lbs Muscle Mass (lbs): 154.4 lbs Total Body Water (lbs): 112 lbs Visceral Fat Rating : 16    HPI  Chief Complaint: OBESITY  Shirley Briggs is here to discuss her progress with her obesity treatment plan. She is on the the Category 2 Plan and states she is following her eating plan approximately 15 % of the time. She states she is walking more daily.    Interval History:  Since last office visit she is down 1 lb She traveled for Thanksgiving, celebrated her birthday and friend's birthdays She has a net loss of 2 lb in the past 3 mos She denies overeating/ over snacking but struggles more with poor food choices and eating out more, less meal prep She plans to cook for this week She has some menstrual sugar cravings She has had more ETOH with celebrations She plans to drink more water She has done more walking  Pharmacotherapy: Wellbutrin SR 150 mg daily and metformin 500 mg daily  PHYSICAL EXAM:  Blood pressure 109/65, pulse 82, temperature 97.6 F (36.4 C), height 5' 10.5" (1.791 m), weight (!) 341 lb (154.7 kg), last menstrual period 11/03/2022, SpO2 94%. Body mass index is 48.24 kg/m.  General: She is overweight, cooperative, alert, well developed, and in no acute distress. PSYCH: Has normal mood, affect and thought process.   Lungs: Normal breathing effort, no conversational dyspnea.   ASSESSMENT AND PLAN  TREATMENT PLAN FOR OBESITY:  Recommended Dietary Goals  Shandra is currently in the action stage of change. As such, her goal is to continue weight management plan. She has agreed to the Category 3 Plan.  Behavioral Intervention  We discussed the following  Behavioral Modification Strategies today: increasing lean protein intake to established goals, increasing vegetables, increasing lower glycemic fruits, increasing fiber rich foods, avoiding skipping meals, increasing water intake , work on meal planning and preparation, keeping healthy foods at home, identifying sources and decreasing liquid calories, work on managing stress, creating time for self-care and relaxation, avoiding temptations and identifying enticing environmental cues, planning for success, better snacking choices, staying on track while traveling and vacationing, celebration eating strategies, and continue to work on maintaining a reduced calorie state, getting the recommended amount of protein, incorporating whole foods, making healthy choices, staying well hydrated and practicing mindfulness when eating..  Additional resources provided today: NA  Recommended Physical Activity Goals  [name] has been advised to work up to 150 minutes of moderate intensity aerobic activity a week and strengthening exercises 2-3 times per week for cardiovascular health, weight loss maintenance and preservation of muscle mass.   She has agreed to Think about enjoyable ways to increase daily physical activity and overcoming barriers to exercise and Increase physical activity in their day and reduce sedentary time (increase NEAT).  Pharmacotherapy changes for the treatment of obesity: Increase metformin to 500 mg twice daily  ASSOCIATED CONDITIONS ADDRESSED TODAY  Eating disorder, unspecified type Assessment & Plan: Disordered eating due to emotional eating has improved somewhat on bupropion SR 150 mg once daily.  She denies adverse side effects.  She has a good support system.  Stress levels are stable.  She is working on  improving sleep at night and practicing mindful eating.  Consider increasing bupropion dose next visit and/or adding CBT with Dr. Dewaine Conger  Orders: -     buPROPion HCl ER (SR); Take  1 tablet (150 mg total) by mouth daily.  Dispense: 30 tablet; Refill: 1  Prediabetes Assessment & Plan: Lab Results  Component Value Date   HGBA1C 6.0 (H) 10/10/2022   She has done well on metformin 500 mg once daily with food.  Reviewed labs from last visit.  Chemistry panel stable.  She is working on her prescribed dietary plan but has been fairly nonadherent due to travel and celebrations.  She is more mindful of her intake of sweets.  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  Continue working on prescribed dietary plan.  Will be working on increasing number of steps and tracking on a consistent basis.  Increase metformin to 500 mg twice daily, taking 1 tab with breakfast and 1 tab with dinner daily.  Orders: -     metFORMIN HCl; Take 1 tablet (500 mg total) by mouth 2 (two) times daily with a meal.  Dispense: 60 tablet; Refill: 1  Vitamin D deficiency Assessment & Plan: Last vitamin D Lab Results  Component Value Date   VD25OH 16.3 (L) 10/10/2022   She is doing well on vitamin D 50,000 IU once weekly.  Her energy level is starting to improve.  Recheck vitamin D level in the next 2 months  Orders: -     Vitamin D (Ergocalciferol); Take 1 capsule (50,000 Units total) by mouth every 7 (seven) days.  Dispense: 5 capsule; Refill: 1  Class 3 severe obesity due to excess calories with body mass index (BMI) of 45.0 to 49.9 in adult, unspecified whether serious comorbidity present (HCC)  Abnormal thyroid blood test Assessment & Plan: Reviewed labs from last visit.  TSH and free T4 both normal.  Energy levels are stable.  Will continue to follow.  No further workup needed.       She was informed of the importance of frequent follow up visits to maximize her success with intensive lifestyle modifications for her multiple health conditions.   ATTESTASTION STATEMENTS:  Reviewed by clinician on day of visit: allergies, medications, problem list,  medical history, surgical history, family history, social history, and previous encounter notes pertinent to obesity diagnosis.   I have personally spent 30 minutes total time today in preparation, patient care, nutritional counseling and documentation for this visit, including the following: review of clinical lab tests; review of medical tests/procedures/services.      Glennis Brink, DO DABFM, DABOM [facility] [address] [place], [place] [address] [phone]

## 2022-12-18 NOTE — Assessment & Plan Note (Signed)
Last vitamin D Lab Results  Component Value Date   VD25OH 16.3 (L) 10/10/2022   She is doing well on vitamin D 50,000 IU once weekly.  Her energy level is starting to improve.  Recheck vitamin D level in the next 2 months

## 2022-12-20 ENCOUNTER — Other Ambulatory Visit: Payer: Self-pay

## 2022-12-26 ENCOUNTER — Other Ambulatory Visit: Payer: Self-pay

## 2022-12-27 ENCOUNTER — Other Ambulatory Visit: Payer: Self-pay

## 2023-01-04 ENCOUNTER — Other Ambulatory Visit: Payer: Self-pay

## 2023-01-07 ENCOUNTER — Other Ambulatory Visit: Payer: Self-pay

## 2023-01-20 ENCOUNTER — Ambulatory Visit
Admission: EM | Admit: 2023-01-20 | Discharge: 2023-01-20 | Disposition: A | Discharge status: Home / Self Care | Payer: BC Managed Care – PPO | Attending: Physician Assistant | Admitting: Physician Assistant

## 2023-01-20 DIAGNOSIS — J069 Acute upper respiratory infection, unspecified: Secondary | ICD-10-CM

## 2023-01-20 LAB — POCT INFLUENZA A/B
Influenza A, POC: NEGATIVE
Influenza B, POC: NEGATIVE

## 2023-01-20 NOTE — ED Triage Notes (Signed)
"  I have a bad bad ha with body aches & my throat is really really sore and dry". "A lady at my job has Pna and/or exposure". No fever known.  No cough. No runny nose. Symptoms started "last night".

## 2023-01-20 NOTE — ED Provider Notes (Signed)
 [facility]    CSN: [id number] Arrival date & time: 01/20/23  1315      History   Chief Complaint Chief Complaint  Patient presents with   Generalized Body Aches   Headache   Sore Throat    HPI Shirley Briggs is a 28 y.o. female.   Patient here today for evaluation of headache, body aches and sore throat that started last night.  She has not had any cough.  She has not had any fever.  Someone at her work has pneumonia.  The history is provided by the patient.  Headache Associated symptoms: congestion, myalgias and sore throat   Associated symptoms: no abdominal pain, no cough, no diarrhea, no ear pain, no fever, no nausea and no vomiting   Sore Throat Associated symptoms include headaches. Pertinent negatives include no abdominal pain and no shortness of breath.    Past Medical History:  Diagnosis Date   Anemia 11/06/2017   Anxiety    Depression    Iron deficiency anemia due to chronic blood loss    Pre-diabetes    Shortness of breath    Sleep apnea    Urticaria     Patient Active Problem List   Diagnosis Date Noted   Abnormal thyroid  blood test 10/24/2022   Iron deficiency 10/24/2022   Insulin  resistance 10/24/2022   Vitamin D  deficiency 10/24/2022   Other fatigue 10/10/2022   SOBOE (shortness of breath on exertion) 10/10/2022   Absolute anemia 10/10/2022   OSA (obstructive sleep apnea) 10/10/2022   Depression 10/10/2022   Depression screen 10/10/2022   BMI 45.0-49.9, adult (HCC) 10/10/2022   Eating disorder 09/26/2022   Full body hives 07/11/2021   Cervical cancer screening 03/21/2021   Prediabetes 03/21/2021   Morbid obesity (HCC) 09/04/2018    Past Surgical History:  Procedure Laterality Date   CLEFT LIP REPAIR      OB History     Gravida  0   Para  0   Term  0   Preterm  0   AB  0   Living  0      SAB  0   IAB  0   Ectopic  0   Multiple  0   Live Births  0            Home Medications     Prior to Admission medications   Medication Sig Start Date End Date Taking? Authorizing Provider  cetirizine  (ZYRTEC ) 10 MG tablet Take 1 tablet (10 mg total) by mouth daily. 05/23/22  Yes Brien Belvie BRAVO, MD  buPROPion  (WELLBUTRIN  XL) 300 MG 24 hr tablet Take 1 tablet (300 mg total) by mouth daily. 01/22/23   Bowen, Darice BRAVO, DO  famotidine  (PEPCID ) 20 MG tablet Take 1 tablet (20 mg total) by mouth daily. 05/23/22   Brien Belvie BRAVO, MD  hydrOXYzine  (ATARAX ) 25 MG tablet Take 1 tablet (25 mg total) by mouth 3 (three) times daily as needed for itching. 05/23/22   Brien Belvie BRAVO, MD  metFORMIN  (GLUCOPHAGE ) 1000 MG tablet Take 1 tablet (1,000 mg total) by mouth daily with supper. 01/22/23   Bowen, Darice BRAVO, DO  Vitamin D , Ergocalciferol , (DRISDOL ) 1.25 MG (50000 UNIT) CAPS capsule Take 1 capsule (50,000 Units total) by mouth every 7 (seven) days. 01/22/23   Bowen, Darice BRAVO, DO    Family History Family History  Problem Relation Age of Onset   Cancer Mother    Hyperlipidemia Mother    Hypertension Mother  Anemia Mother    Anxiety disorder Mother    Depression Mother    Bipolar disorder Mother    Liver disease Mother    Alcohol abuse Mother    Drug abuse Mother    Obesity Father    Allergic rhinitis Sister    Anemia Maternal Grandmother    Heart disease Maternal Grandmother    Sickle cell anemia Neg Hx    Sickle cell trait Neg Hx     Social History Social History   Tobacco Use   Smoking status: Never    Passive exposure: Current   Smokeless tobacco: Never   Tobacco comments:    Aunt.   Vaping Use   Vaping status: Never Used  Substance Use Topics   Alcohol use: Yes    Comment: Occassionally.   Drug use: Never     Allergies   Patient has no known allergies.   Review of Systems Review of Systems  Constitutional:  Negative for chills and fever.  HENT:  Positive for congestion and sore throat. Negative for ear pain.   Eyes:  Negative for discharge and redness.   Respiratory:  Negative for cough, shortness of breath and wheezing.   Gastrointestinal:  Negative for abdominal pain, diarrhea, nausea and vomiting.  Musculoskeletal:  Positive for myalgias.  Neurological:  Positive for headaches.     Physical Exam Triage Vital Signs ED Triage Vitals  Encounter Vitals Group     BP 01/20/23 1518 108/67     Systolic BP Percentile --      Diastolic BP Percentile --      Pulse Rate 01/20/23 1518 94     Resp 01/20/23 1518 20     Temp 01/20/23 1518 98.4 F (36.9 C)     Temp Source 01/20/23 1518 Oral     SpO2 01/20/23 1518 99 %     Weight 01/20/23 1515 (!) 341 lb (154.7 kg)     Height 01/20/23 1515 5' 11 (1.803 m)     Head Circumference --      Peak Flow --      Pain Score 01/20/23 1513 8     Pain Loc --      Pain Education --      Exclude from Growth Chart --    No data found.  Updated Vital Signs BP 108/67 (BP Location: Left Arm)   Pulse 94   Temp 98.4 F (36.9 C) (Oral)   Resp 20   Ht 5' 11 (1.803 m)   Wt (!) 341 lb (154.7 kg)   LMP 01/02/2023 (Exact Date)   SpO2 99%   BMI 47.56 kg/m   Visual Acuity Right Eye Distance:   Left Eye Distance:   Bilateral Distance:    Right Eye Near:   Left Eye Near:    Bilateral Near:     Physical Exam Vitals and nursing note reviewed.  Constitutional:      General: She is not in acute distress.    Appearance: Normal appearance. She is not ill-appearing.  HENT:     Head: Normocephalic and atraumatic.     Right Ear: Tympanic membrane normal.     Left Ear: Tympanic membrane normal.     Nose: Congestion present.     Mouth/Throat:     Mouth: Mucous membranes are moist.     Pharynx: No oropharyngeal exudate or posterior oropharyngeal erythema.  Eyes:     Conjunctiva/sclera: Conjunctivae normal.  Cardiovascular:     Rate and Rhythm: Normal rate and  regular rhythm.     Heart sounds: Normal heart sounds. No murmur heard. Pulmonary:     Effort: Pulmonary effort is normal. No respiratory  distress.     Breath sounds: Normal breath sounds. No wheezing, rhonchi or rales.  Skin:    General: Skin is warm and dry.  Neurological:     Mental Status: She is alert.  Psychiatric:        Mood and Affect: Mood normal.        Thought Content: Thought content normal.      UC Treatments / Results  Labs (all labs ordered are listed, but only abnormal results are displayed) Labs Reviewed  POCT INFLUENZA A/B - Normal  SARS CORONAVIRUS 2 (TAT 6-24 HRS)    EKG   Radiology No results found.  Procedures Procedures (including critical care time)  Medications Ordered in UC Medications - No data to display  Initial Impression / Assessment and Plan / UC Course  I have reviewed the triage vital signs and the nursing notes.  Pertinent labs & imaging results that were available during my care of the patient were reviewed by me and considered in my medical decision making (see chart for details).    Suspect likely viral etiology of upper respiratory symptoms.  Flu test negative in office.  Will screen for COVID.  Discussed less likely pneumonia given lack of fever but advised follow-up if symptoms persist.  Final Clinical Impressions(s) / UC Diagnoses   Final diagnoses:  Acute upper respiratory infection   Discharge Instructions   None    ED Prescriptions   None    PDMP not reviewed this encounter.   Billy Asberry FALCON, PA-C 01/23/23 [phone]

## 2023-01-21 LAB — SARS CORONAVIRUS 2 (TAT 6-24 HRS): SARS Coronavirus 2: NEGATIVE

## 2023-01-22 ENCOUNTER — Encounter: Payer: Self-pay | Admitting: Family Medicine

## 2023-01-22 ENCOUNTER — Ambulatory Visit (INDEPENDENT_AMBULATORY_CARE_PROVIDER_SITE_OTHER): Payer: BC Managed Care – PPO | Admitting: Family Medicine

## 2023-01-22 VITALS — BP 131/72 | HR 74 | Temp 97.8°F | Ht 70.5 in | Wt 337.0 lb

## 2023-01-22 DIAGNOSIS — R7303 Prediabetes: Secondary | ICD-10-CM

## 2023-01-22 DIAGNOSIS — E66813 Obesity, class 3: Secondary | ICD-10-CM

## 2023-01-22 DIAGNOSIS — E559 Vitamin D deficiency, unspecified: Secondary | ICD-10-CM

## 2023-01-22 DIAGNOSIS — F509 Eating disorder, unspecified: Secondary | ICD-10-CM

## 2023-01-22 DIAGNOSIS — Z6841 Body Mass Index (BMI) 40.0 and over, adult: Secondary | ICD-10-CM

## 2023-01-22 MED ORDER — VITAMIN D (ERGOCALCIFEROL) 1.25 MG (50000 UNIT) PO CAPS: 50000.0000 [IU] | ORAL_CAPSULE | ORAL | 1 refills | Status: DC

## 2023-01-22 MED ORDER — METFORMIN HCL 1000 MG PO TABS: 1000.0000 mg | ORAL_TABLET | Freq: Every day | ORAL | 0 refills | Status: DC

## 2023-01-22 MED ORDER — BUPROPION HCL ER (XL) 300 MG PO TB24: 300.0000 mg | ORAL_TABLET | Freq: Every day | ORAL | 0 refills | Status: DC

## 2023-01-22 NOTE — Patient Instructions (Signed)
 Move metformin to 1000 mg at dinner daily  Change Buproprion to XL 300 mg once pill in the morning  Aim to track steps daily Think about ideas to add in more physical activity

## 2023-01-22 NOTE — Progress Notes (Signed)
 Office: [phone]  /  Fax: [phone]  WEIGHT SUMMARY AND BIOMETRICS  Starting Date: 10/10/22  Starting Weight: 343lb   Weight Lost Since Last Visit: 4lb   Vitals Temp: 97.8 F (36.6 C) BP: 131/72 Pulse Rate: 74 SpO2: 100 %   Body Composition  Body Fat %: 51.9 % Fat Mass (lbs): 175.2 lbs Muscle Mass (lbs): 154 lbs Total Body Water (lbs): 112 lbs Visceral Fat Rating : 15   HPI  Chief Complaint: OBESITY  Shirley Briggs is here to discuss her progress with her obesity treatment plan. She is on the the Category 2 Plan and states she is following her eating plan approximately 65 % of the time. She states she is exercising 0 minutes 0 times per week.  Interval History:  Since last office visit she is down 4 lb This gives her a net weight loss of 6 lb in the past 3 mos of medically supervised weight management She tolerated metformin  500 mg bid She remains on buproprion SR 150 mg daily She has some nausea mid morning She gets up at 8:30am  She eats breakfast around 11am (at work) She may have a waffle, turkey bacon, eggs or [employer] for breakfast She denies nighttime eating She has not started to exercise yet  Pharmacotherapy: metformin  500 mg bid and buproprion SR (taking 150 mg - 2 tabs once a day)  PHYSICAL EXAM:  Blood pressure 131/72, pulse 74, temperature 97.8 F (36.6 C), height 5' 10.5 (1.791 m), weight (!) 337 lb (152.9 kg), last menstrual period 01/02/2023, SpO2 100%. Body mass index is 47.67 kg/m.  General: She is overweight, cooperative, alert, well developed, and in no acute distress. PSYCH: Has normal mood, affect and thought process.   Lungs: Normal breathing effort, no conversational dyspnea.   ASSESSMENT AND PLAN  TREATMENT PLAN FOR OBESITY:  Recommended Dietary Goals  Shirley Briggs is currently in the action stage of change. As such, her goal is to continue weight management plan. She has agreed to the Category 3 Plan.  Behavioral  Intervention  We discussed the following Behavioral Modification Strategies today: increasing lean protein intake to established goals, increasing vegetables, increasing lower glycemic fruits, increasing water intake , work on meal planning and preparation, keeping healthy foods at home, work on managing stress, creating time for self-care and relaxation, continue to practice mindfulness when eating, planning for success, and continue to work on maintaining a reduced calorie state, getting the recommended amount of protein, incorporating whole foods, making healthy choices, staying well hydrated and practicing mindfulness when eating..  Additional resources provided today: NA  Recommended Physical Activity Goals  Shirley Briggs has been advised to work up to 150 minutes of moderate intensity aerobic activity a week and strengthening exercises 2-3 times per week for cardiovascular health, weight loss maintenance and preservation of muscle mass.   She has agreed to Start aerobic activity with a goal of 150 minutes a week at moderate intensity.   Pharmacotherapy changes for the treatment of obesity: moved buproprion to XL 300 mg in the morning and changed metformin  to 1000 mg with dinner  ASSOCIATED CONDITIONS ADDRESSED TODAY  Eating disorder, unspecified type -     buPROPion  HCl ER (XL); Take 1 tablet (300 mg total) by mouth daily.  Dispense: 30 tablet; Refill: 0  Prediabetes -     metFORMIN  HCl; Take 1 tablet (1,000 mg total) by mouth daily with supper.  Dispense: 30 tablet; Refill: 0  Vitamin D  deficiency -  Vitamin D  (Ergocalciferol ); Take 1 capsule (50,000 Units total) by mouth every 7 (seven) days.  Dispense: 5 capsule; Refill: 1  Class 3 severe obesity due to excess calories with body mass index (BMI) of 45.0 to 49.9 in adult, unspecified whether serious comorbidity present [facility])      She was informed of the importance of frequent follow up visits to maximize her success with intensive  lifestyle modifications for her multiple health conditions.   ATTESTASTION STATEMENTS:  Reviewed by clinician on day of visit: allergies, medications, problem list, medical history, surgical history, family history, social history, and previous encounter notes pertinent to obesity diagnosis.   I have personally spent 30 minutes total time today in preparation, patient care, nutritional counseling and documentation for this visit, including the following: review of clinical lab tests; review of medical tests/procedures/services.      Shirley FORBES Haddock, DO DABFM, DABOM [facility] [address] [place], [place] [address] [phone]

## 2023-02-11 DIAGNOSIS — F331 Major depressive disorder, recurrent, moderate: Secondary | ICD-10-CM | POA: Diagnosis not present

## 2023-02-11 DIAGNOSIS — F411 Generalized anxiety disorder: Secondary | ICD-10-CM | POA: Diagnosis not present

## 2023-02-20 ENCOUNTER — Ambulatory Visit (INDEPENDENT_AMBULATORY_CARE_PROVIDER_SITE_OTHER): Payer: BC Managed Care – PPO | Admitting: Family Medicine

## 2023-02-20 ENCOUNTER — Encounter: Payer: Self-pay | Admitting: Family Medicine

## 2023-02-20 VITALS — BP 133/84 | HR 71 | Temp 98.0°F | Ht 70.5 in | Wt 336.0 lb

## 2023-02-20 DIAGNOSIS — Z6841 Body Mass Index (BMI) 40.0 and over, adult: Secondary | ICD-10-CM

## 2023-02-20 DIAGNOSIS — R5383 Other fatigue: Secondary | ICD-10-CM | POA: Diagnosis not present

## 2023-02-20 DIAGNOSIS — F509 Eating disorder, unspecified: Secondary | ICD-10-CM

## 2023-02-20 DIAGNOSIS — E66813 Obesity, class 3: Secondary | ICD-10-CM

## 2023-02-20 DIAGNOSIS — E559 Vitamin D deficiency, unspecified: Secondary | ICD-10-CM | POA: Diagnosis not present

## 2023-02-20 DIAGNOSIS — R7303 Prediabetes: Secondary | ICD-10-CM | POA: Diagnosis not present

## 2023-02-20 MED ORDER — METFORMIN HCL 1000 MG PO TABS: 1000.0000 mg | ORAL_TABLET | Freq: Every day | ORAL | 0 refills | Status: DC

## 2023-02-20 MED ORDER — WEGOVY 0.25 MG/0.5ML ~~LOC~~ SOAJ: 0.2500 mg | SUBCUTANEOUS | 0 refills | Status: DC

## 2023-02-20 MED ORDER — VITAMIN D (ERGOCALCIFEROL) 1.25 MG (50000 UNIT) PO CAPS: 50000.0000 [IU] | ORAL_CAPSULE | ORAL | 1 refills | Status: DC

## 2023-02-20 MED ORDER — BUPROPION HCL ER (XL) 300 MG PO TB24: 300.0000 mg | ORAL_TABLET | Freq: Every day | ORAL | 0 refills | Status: DC

## 2023-02-20 NOTE — Progress Notes (Signed)
 Office: [phone]  /  Fax: [phone]  WEIGHT SUMMARY AND BIOMETRICS  Starting Date: 10/10/22  Starting Weight: 343lb   Weight Lost Since Last Visit: 1lb   Vitals Temp: 98 F (36.7 C) BP: 133/84 Pulse Rate: 71 SpO2: 96 %   Body Composition  Body Fat %: 52.4 % Fat Mass (lbs): 176.2 lbs Muscle Mass (lbs): 152.2 lbs Total Body Water (lbs): 114 lbs Visceral Fat Rating : 15    HPI  Chief Complaint: OBESITY  Shirley Briggs is here to discuss her progress with her obesity treatment plan. She is on the the Category 3 Plan and states she is following her eating plan approximately 25 % of the time. She states she is exercising 0 minutes 0 times per week.  Interval History:  Since last office visit she is down 1 lb She has felt hungrier lately Denies meal skipping, less sleep or high stress She is getting in lean protein and fiber with meals She moved her Wellbutrin  to XL 300 mg in the AM and metformin  to 1000 mg at dinner last visit, tolerating both well She has a net weight loss of 7 lb in 4 mos Exercise has not started.  She plans to add in the gym 2 days/ wk Her work schedule is a barrier Her friends are supportive  Pharmacotherapy: metformin  1000 mg once daily and Wellbutrin  XL 300 mg once daily  PHYSICAL EXAM:  Blood pressure 133/84, pulse 71, temperature 98 F (36.7 C), height 5' 10.5 (1.791 m), weight (!) 336 lb (152.4 kg), SpO2 96%. Body mass index is 47.53 kg/m.  General: She is overweight, cooperative, alert, well developed, and in no acute distress. PSYCH: Has normal mood, affect and thought process.   Lungs: Normal breathing effort, no conversational dyspnea.   ASSESSMENT AND PLAN  TREATMENT PLAN FOR OBESITY:  Recommended Dietary Goals  [name] is currently in the action stage of change. As such, her goal is to continue weight management plan. She has agreed to the Category 3 Plan.  Behavioral Intervention  We discussed the following  Behavioral Modification Strategies today: increasing lean protein intake to established goals, increasing fiber rich foods, increasing water intake , work on meal planning and preparation, keeping healthy foods at home, decreasing eating out or consumption of processed foods, and making healthy choices when eating convenient foods, practice mindfulness eating and understand the difference between hunger signals and cravings, avoiding temptations and identifying enticing environmental cues, continue to work on implementation of reduced calorie nutritional plan, planning for success, and continue to work on maintaining a reduced calorie state, getting the recommended amount of protein, incorporating whole foods, making healthy choices, staying well hydrated and practicing mindfulness when eating..  Additional resources provided today: NA  Recommended Physical Activity Goals  [name] has been advised to work up to 150 minutes of moderate intensity aerobic activity a week and strengthening exercises 2-3 times per week for cardiovascular health, weight loss maintenance and preservation of muscle mass.   She has agreed to [employer] strengthening exercises with a goal of 2-3 sessions a week   Pharmacotherapy changes for the treatment of obesity: begin Wegovy  0.25 mg once weekly injection Patient denies a personal or family history of pancreatitis, medullary thyroid  carcinoma or multiple endocrine neoplasia type II. Recommend reviewing pen training video online.  Check out [email or web address] .[email or web address] Avoid pregnancy while on Wegovy  (abstinent, using condoms)  ASSOCIATED CONDITIONS ADDRESSED TODAY  Prediabetes -     metFORMIN  HCl; Take 1 tablet (1,000 mg total) by  mouth daily with supper.  Dispense: 30 tablet; Refill: 0 -     Hemoglobin A1c  Vitamin D  deficiency -     Vitamin D  (Ergocalciferol ); Take 1 capsule (50,000 Units total) by mouth every 7 (seven) days.  Dispense: 5 capsule; Refill: 1 -     VITAMIN D  25 Hydroxy  (Vit-D Deficiency, Fractures)  Eating disorder, unspecified type -     buPROPion  HCl ER (XL); Take 1 tablet (300 mg total) by mouth daily.  Dispense: 30 tablet; Refill: 0  Class 3 severe obesity due to excess calories with serious comorbidity and body mass index (BMI) of 45.0 to 49.9 in adult (HCC) -     Wegovy ; Inject 0.25 mg into the skin once a week.  Dispense: 2 mL; Refill: 0  Other fatigue -     Comprehensive metabolic panel -     Vitamin B12      She was informed of the importance of frequent follow up visits to maximize her success with intensive lifestyle modifications for her multiple health conditions.   ATTESTASTION STATEMENTS:  Reviewed by clinician on day of visit: allergies, medications, problem list, medical history, surgical history, family history, social history, and previous encounter notes pertinent to obesity diagnosis.   I have personally spent 30 minutes total time today in preparation, patient care, nutritional counseling and documentation for this visit, including the following: review of clinical lab tests; review of medical tests/procedures/services.      Darice FORBES Haddock, DO DABFM, DABOM [facility] [address] [place], [place] [address] [phone]

## 2023-02-21 ENCOUNTER — Telehealth: Payer: Self-pay

## 2023-02-21 LAB — COMPREHENSIVE METABOLIC PANEL
ALT: 11 [IU]/L (ref 0–32)
AST: 9 [IU]/L (ref 0–40)
Albumin: 4 g/dL (ref 4.0–5.0)
Alkaline Phosphatase: 112 [IU]/L (ref 44–121)
BUN/Creatinine Ratio: 13 (ref 9–23)
BUN: 9 mg/dL (ref 6–20)
Bilirubin Total: 0.2 mg/dL (ref 0.0–1.2)
CO2: 21 mmol/L (ref 20–29)
Calcium: 8.9 mg/dL (ref 8.7–10.2)
Chloride: 102 mmol/L (ref 96–106)
Creatinine, Ser: 0.67 mg/dL (ref 0.57–1.00)
Globulin, Total: 3.1 g/dL (ref 1.5–4.5)
Glucose: 75 mg/dL (ref 70–99)
Potassium: 4.6 mmol/L (ref 3.5–5.2)
Sodium: 138 mmol/L (ref 134–144)
Total Protein: 7.1 g/dL (ref 6.0–8.5)
eGFR: 123 mL/min/{1.73_m2} (ref >59)

## 2023-02-21 LAB — HEMOGLOBIN A1C
Est. average glucose Bld gHb Est-mCnc: 126 mg/dL
Hgb A1c MFr Bld: 6 % — ABNORMAL HIGH (ref 4.8–5.6)

## 2023-02-21 LAB — VITAMIN D 25 HYDROXY (VIT D DEFICIENCY, FRACTURES): Vit D, 25-Hydroxy: 42 ng/mL (ref 30.0–100.0)

## 2023-02-21 LAB — VITAMIN B12: Vitamin B-12: 474 pg/mL (ref 232–1245)

## 2023-02-21 NOTE — Telephone Encounter (Signed)
 PA submitted through Cover My Meds for Wegovy . Key: [id number]  Per patients insurance: PA Case: [id number], Status: Denied, Denial Rationale: The requested medication is excluded as a plan benefit. Therefore, we are unable to continue processing the prior authorization for the requested medication due to plan benefit exclusions. Coverage for Wegovy  0.25MG /0.5ML Solution Auto-Injector is denied. Specific drugs or categories of drugs are not covered by your plan. These drugs or categories of drugs are called exclusions. Weight loss medications, including Wegovy  0.25MG /0.5ML Solution Auto-Injector, are excluded from your pharmacy benefits. Therefore, coverage for Wegovy  0.25MG /0.5ML Solution Auto-Injector is denied. Questions? Contact [phone].

## 2023-02-26 DIAGNOSIS — F331 Major depressive disorder, recurrent, moderate: Secondary | ICD-10-CM | POA: Diagnosis not present

## 2023-02-26 DIAGNOSIS — F411 Generalized anxiety disorder: Secondary | ICD-10-CM | POA: Diagnosis not present

## 2023-03-14 DIAGNOSIS — F331 Major depressive disorder, recurrent, moderate: Secondary | ICD-10-CM | POA: Diagnosis not present

## 2023-03-14 DIAGNOSIS — F411 Generalized anxiety disorder: Secondary | ICD-10-CM | POA: Diagnosis not present

## 2023-03-20 ENCOUNTER — Ambulatory Visit (INDEPENDENT_AMBULATORY_CARE_PROVIDER_SITE_OTHER): Payer: BC Managed Care – PPO | Admitting: Family Medicine

## 2023-03-20 ENCOUNTER — Encounter: Payer: Self-pay | Admitting: Family Medicine

## 2023-03-20 VITALS — BP 122/78 | HR 78 | Temp 98.3°F | Ht 70.5 in | Wt 339.0 lb

## 2023-03-20 DIAGNOSIS — F509 Eating disorder, unspecified: Secondary | ICD-10-CM | POA: Diagnosis not present

## 2023-03-20 DIAGNOSIS — E559 Vitamin D deficiency, unspecified: Secondary | ICD-10-CM | POA: Diagnosis not present

## 2023-03-20 DIAGNOSIS — R632 Polyphagia: Secondary | ICD-10-CM | POA: Insufficient documentation

## 2023-03-20 DIAGNOSIS — Z6841 Body Mass Index (BMI) 40.0 and over, adult: Secondary | ICD-10-CM

## 2023-03-20 DIAGNOSIS — E66813 Obesity, class 3: Secondary | ICD-10-CM | POA: Diagnosis not present

## 2023-03-20 DIAGNOSIS — R7303 Prediabetes: Secondary | ICD-10-CM | POA: Diagnosis not present

## 2023-03-20 MED ORDER — PHENTERMINE HCL 15 MG PO CAPS: ORAL_CAPSULE | ORAL | 0 refills | Status: DC

## 2023-03-20 MED ORDER — METFORMIN HCL 1000 MG PO TABS: 1000.0000 mg | ORAL_TABLET | Freq: Every day | ORAL | 0 refills | Status: DC

## 2023-03-20 NOTE — Progress Notes (Signed)
 Office: [phone]  /  Fax: [phone]  WEIGHT SUMMARY AND BIOMETRICS  Starting Date: 10/10/22  Starting Weight: 343lb   Weight Lost Since Last Visit: 0lb   Vitals Temp: 98.3 F (36.8 C) BP: 122/78 Pulse Rate: 78 SpO2: 99 %   Body Composition  Body Fat %: 52.7 % Fat Mass (lbs): 178.8 lbs Muscle Mass (lbs): 152.6 lbs Total Body Water (lbs): 115.6 lbs Visceral Fat Rating : 16    HPI  Chief Complaint: OBESITY  Shirley Briggs is here to discuss her progress with her obesity treatment plan. She is on the the Category 2 Plan and states she is following her eating plan approximately 0 % of the time. She states she is exercising 0 minutes 0 times per week.  Interval History:  Since last office visit she is up 3 lb She is up 0.4 pounds of muscle mass and up to 0.6 pounds of body fat since her last visit She has a net weight loss of 4 pounds in 5 months of medically supervised weight management She has been feeling increased hunger early in the day with craving sweets at nights She is not skipping meals She is drinking more water  She has been working alternating shifts limiting little time for meal planning and walking She continues to struggle off-and-on with binge eating and emotional eating tendencies  Pharmacotherapy: Wellbutrin XL 300 mg in the morning and metformin 1000 mg at dinner  PHYSICAL EXAM:  Blood pressure 122/78, pulse 78, temperature 98.3 F (36.8 C), height 5' 10.5" (1.791 m), weight (!) 339 lb (153.8 kg), SpO2 99%. Body mass index is 47.95 kg/m.  General: She is overweight, cooperative, alert, well developed, and in no acute distress. PSYCH: Has normal mood, affect and thought process.   Lungs: Normal breathing effort, no conversational dyspnea.   ASSESSMENT AND PLAN  TREATMENT PLAN FOR OBESITY:  Recommended Dietary Goals  Shirley Briggs is currently in the action stage of change. As such, her goal is to continue weight management plan. She has  agreed to the Category 3 Plan.  Behavioral Intervention  We discussed the following Behavioral Modification Strategies today: increasing lean protein intake to established goals, increasing fiber rich foods, work on meal planning and preparation, work on tracking and journaling calories using tracking application, keeping healthy foods at home, identifying sources and decreasing liquid calories, practice mindfulness eating and understand the difference between hunger signals and cravings, work on managing stress, creating time for self-care and relaxation, avoiding temptations and identifying enticing environmental cues, continue to work on implementation of reduced calorie nutritional plan, and continue to work on maintaining a reduced calorie state, getting the recommended amount of protein, incorporating whole foods, making healthy choices, staying well hydrated and practicing mindfulness when eating..  Additional resources provided today: NA  Recommended Physical Activity Goals  Shirley Briggs has been advised to work up to 150 minutes of moderate intensity aerobic activity a week and strengthening exercises 2-3 times per week for cardiovascular health, weight loss maintenance and preservation of muscle mass.   She has agreed to Think about enjoyable ways to increase daily physical activity and overcoming barriers to exercise and Increase physical activity in their day and reduce sedentary time (increase NEAT).  Pharmacotherapy changes for the treatment of obesity: Begin phentermine 15 mg capsule 30 minutes before breakfast daily Avoid pregnancy while on phentermine Informed consent signed PDMP reviewed Reviewed mechanism of action and potential adverse side effects  ASSOCIATED CONDITIONS ADDRESSED TODAY  Vitamin D deficiency Improved.  Reviewed labs from last visit.  Her vitamin D level has improved significantly on vitamin D 50,000 IU once weekly.  Her energy level is improving some.  Will  switch her vitamin D over to over-the-counter vitamin D at 2000 IU once daily for maintenance  Eating disorder, unspecified type Stable.  Patient continues to struggle with occasional binge eating tendencies, emotional eating with a poor support system at home.  She has not seen any improvements on Wellbutrin XL 300 mg once daily.  She denies any change in mood.  Will discontinue Wellbutrin.  Look for improvements in appetite and cravings on phentermine.  Prediabetes Unchanged.  Her A1c is stable at 6.  She has struggled to see adequate weight loss over the past 5 months of medically supervised weight management.  She is doing well on metformin 1000 mg once daily with dinner without adverse side effect.  She is still working actively on reducing her intake of sugar and starches.  She has been slow to add in regular exercise.  -     metFORMIN HCl; Take 1 tablet (1,000 mg total) by mouth daily with supper.  Dispense: 30 tablet; Refill: 0  Class 3 severe obesity due to excess calories with serious comorbidity and body mass index (BMI) of 45.0 to 49.9 in adult (HCC) -     Phentermine HCl; 1 capsule po 30 min before breakfast daily  Dispense: 30 capsule; Refill: 0  Polyphagia      She was informed of the importance of frequent follow up visits to maximize her success with intensive lifestyle modifications for her multiple health conditions.   ATTESTASTION STATEMENTS:  Reviewed by clinician on day of visit: allergies, medications, problem list, medical history, surgical history, family history, social history, and previous encounter notes pertinent to obesity diagnosis.   I have personally spent 30 minutes total time today in preparation, patient care, nutritional counseling and documentation for this visit, including the following: review of clinical lab tests; review of medical tests/procedures/services.      Shirley Brink, DO DABFM, DABOM [facility] [address] [place], [place] [address] [phone]

## 2023-03-20 NOTE — Patient Instructions (Signed)
 Stop Wellbutrin Call if any mood changes  Continue Metformin with dinner  Stop RX vitamin D and change to OTC  Begin Phentermine 15 mg capsule 30 min before breakfast OR 1 hr after breakfast

## 2023-04-01 DIAGNOSIS — F411 Generalized anxiety disorder: Secondary | ICD-10-CM | POA: Diagnosis not present

## 2023-04-01 DIAGNOSIS — F331 Major depressive disorder, recurrent, moderate: Secondary | ICD-10-CM | POA: Diagnosis not present

## 2023-04-15 DIAGNOSIS — F411 Generalized anxiety disorder: Secondary | ICD-10-CM | POA: Diagnosis not present

## 2023-04-15 DIAGNOSIS — F331 Major depressive disorder, recurrent, moderate: Secondary | ICD-10-CM | POA: Diagnosis not present

## 2023-04-18 ENCOUNTER — Encounter: Payer: Self-pay | Admitting: Family Medicine

## 2023-04-18 ENCOUNTER — Ambulatory Visit (INDEPENDENT_AMBULATORY_CARE_PROVIDER_SITE_OTHER): Admitting: Family Medicine

## 2023-04-18 VITALS — BP 127/79 | HR 74 | Temp 98.0°F | Ht 70.5 in | Wt 337.0 lb

## 2023-04-18 DIAGNOSIS — Z6841 Body Mass Index (BMI) 40.0 and over, adult: Secondary | ICD-10-CM

## 2023-04-18 DIAGNOSIS — E66813 Obesity, class 3: Secondary | ICD-10-CM

## 2023-04-18 DIAGNOSIS — R632 Polyphagia: Secondary | ICD-10-CM

## 2023-04-18 DIAGNOSIS — R7303 Prediabetes: Secondary | ICD-10-CM

## 2023-04-18 MED ORDER — PHENTERMINE HCL 15 MG PO CAPS: ORAL_CAPSULE | ORAL | 0 refills | Status: DC

## 2023-04-18 MED ORDER — METFORMIN HCL 1000 MG PO TABS: 1000.0000 mg | ORAL_TABLET | Freq: Every day | ORAL | 0 refills | Status: DC

## 2023-04-18 NOTE — Progress Notes (Signed)
 Office: [phone]  /  Fax: [phone]  WEIGHT SUMMARY AND BIOMETRICS  Starting Date: 10/10/22  Starting Weight: 343lb   Weight Lost Since Last Visit: 2lb   Vitals Temp: 98 F (36.7 C) BP: 127/79 Pulse Rate: 74 SpO2: 96 %   Body Composition  Body Fat %: 52.3 % Fat Mass (lbs): 176.2 lbs Muscle Mass (lbs): 152.8 lbs Total Body Water (lbs): 115.6 lbs Visceral Fat Rating : 15    HPI  Chief Complaint: OBESITY  Shirley Briggs is here to discuss her progress with her obesity treatment plan. She is on the the Category 2 Plan and states she is following her eating plan approximately 65 % of the time. She states she is walking more daily and has re joined the gym.  She is on category 3 meal plan  Interval History:  Since last office visit she is down 2 lb She is up 0.2 pounds of muscle mass and down 2.6 pounds of body fat since last visit This gives her a net weight loss of 6 lb in the past 6 mos of medically supervised weight management She has added in hill walking both outside and inside 2-3 x a week She feels that starches are a slippery slope Hunger waxes and wanes She is getting in more fruits and veggies She goes to the gym every Sunday and every other Saturday + 1-2 early shift days of work She has done better with consistency on her prescribed meal plan  Pharmacotherapy: Wellbutrin XL 300 mg and metformin 1000 mg once daily  PHYSICAL EXAM:  Blood pressure 127/79, pulse 74, temperature 98 F (36.7 C), height 5' 10.5" (1.791 m), weight (!) 337 lb (152.9 kg), SpO2 96%. Body mass index is 47.67 kg/m.  General: She is overweight, cooperative, alert, well developed, and in no acute distress. PSYCH: Has normal mood, affect and thought process.   Lungs: Normal breathing effort, no conversational dyspnea.   ASSESSMENT AND PLAN  TREATMENT PLAN FOR OBESITY:  Recommended Dietary Goals  Shirley Briggs is currently in the action stage of change. As such, her goal is to  continue weight management plan. She has agreed to the Category 3 Plan. Reviewed specific dietary change goals together on after visit summary She may change over to a 1600-calorie low-carb diet using Chat GPT  to create a meal plan  Behavioral Intervention  We discussed the following Behavioral Modification Strategies today: increasing lean protein intake to established goals, increasing fiber rich foods, increasing water intake , work on meal planning and preparation, keeping healthy foods at home, identifying sources and decreasing liquid calories, decreasing eating out or consumption of processed foods, and making healthy choices when eating convenient foods, practice mindfulness eating and understand the difference between hunger signals and cravings, work on managing stress, creating time for self-care and relaxation, continue to practice mindfulness when eating, and continue to work on maintaining a reduced calorie state, getting the recommended amount of protein, incorporating whole foods, making healthy choices, staying well hydrated and practicing mindfulness when eating..  Additional resources provided today: NA  Recommended Physical Activity Goals  Shirley Briggs has been advised to work up to 150 minutes of moderate intensity aerobic activity a week and strengthening exercises 2-3 times per week for cardiovascular health, weight loss maintenance and preservation of muscle mass.   She has agreed to Increase the intensity, frequency or duration of strengthening exercises  and Increase the intensity, frequency or duration of aerobic exercises   Aim for gym workouts consistently 3  days a week and adding in outdoor walking 30 minutes 2 days a week  Pharmacotherapy changes for the treatment of obesity: None  ASSOCIATED CONDITIONS ADDRESSED TODAY  Polyphagia Improving.  Unfortunately, patient does not have insurance coverage for antiobesity medication.  She has some improved satiety with the  addition of phentermine 15 mg capsule each morning.  She has done a better job of eating on a schedule, caring healthy foods to work and avoiding meal skipping.  She has room for improvement with water intake.  Continue to avoid high sugar foods and drinks and refined carbohydrates which only trigger appetite.  Blood pressure and heart rate are within normal limits and benefit exceeds the risk to continue phentermine 15 mg each morning.  Prediabetes Lab Results  Component Value Date   HGBA1C 6.0 (H) 02/20/2023  She has stable prediabetes doing well on metformin 1000 mg once daily with food without adverse side effects.  She has been working on reducing her intake of starches and sweets.  She agrees to changing over to a lower carbohydrate diet.  She has been more consistent with gym workouts.  Encouraged consistency with regular exercise with a goal of at least 3 days a week.  -     metFORMIN HCl; Take 1 tablet (1,000 mg total) by mouth daily with supper.  Dispense: 30 tablet; Refill: 0  Class 3 severe obesity due to excess calories with serious comorbidity and body mass index (BMI) of 45.0 to 49.9 in adult (HCC) -     Phentermine HCl; 1 capsule po 30 min before breakfast daily  Dispense: 30 capsule; Refill: 0        She was informed of the importance of frequent follow up visits to maximize her success with intensive lifestyle modifications for her multiple health conditions.   ATTESTASTION STATEMENTS:  Reviewed by clinician on day of visit: allergies, medications, problem list, medical history, surgical history, family history, social history, and previous encounter notes pertinent to obesity diagnosis.   I have personally spent 30 minutes total time today in preparation, patient care, nutritional counseling and documentation for this visit, including the following: review of clinical lab tests; review of medical tests/procedures/services.      Glennis Brink, DO DABFM, DABOM [facility]  [facility] [address] [place], [place] [address] [phone]

## 2023-04-18 NOTE — Patient Instructions (Addendum)
 Go to the Chat GPT app to create low carb meal plans Keep calories 1500-1600 per day This should include 150 g of of protein daily  Ok to eat 1-2 fruit servings per day  Keep up the good work with gym workouts-- aim for 3 days/ wk Add in more walking on other days; aim for 30 min outside of gym days 2 x a week

## 2023-05-13 ENCOUNTER — Other Ambulatory Visit: Payer: Self-pay

## 2023-05-13 DIAGNOSIS — E559 Vitamin D deficiency, unspecified: Secondary | ICD-10-CM

## 2023-05-13 MED ORDER — VITAMIN D (ERGOCALCIFEROL) 1.25 MG (50000 UNIT) PO CAPS
50000.0000 [IU] | ORAL_CAPSULE | ORAL | 0 refills | Status: DC
  Filled 2023-05-13: qty 2, 28d supply, fill #0

## 2023-05-13 NOTE — Telephone Encounter (Signed)
 Lab reviewed along with note.  I will change her RX vitamin D  50,000 international units  to q 14 days in place of taking an OTC daily vitamin D 

## 2023-05-15 ENCOUNTER — Other Ambulatory Visit: Payer: Self-pay

## 2023-05-15 MED ORDER — VITAMIN D (ERGOCALCIFEROL) 1.25 MG (50000 UNIT) PO CAPS: 50000.0000 [IU] | ORAL_CAPSULE | ORAL | 0 refills | Status: DC

## 2023-05-15 NOTE — Addendum Note (Signed)
 Addended by: Luana Rumple on: 05/15/2023 09:05 AM   Modules accepted: Orders

## 2023-05-21 ENCOUNTER — Ambulatory Visit (INDEPENDENT_AMBULATORY_CARE_PROVIDER_SITE_OTHER): Admitting: Family Medicine

## 2023-05-21 ENCOUNTER — Encounter: Payer: Self-pay | Admitting: Family Medicine

## 2023-05-21 VITALS — BP 128/82 | HR 77 | Temp 97.8°F | Ht 70.5 in | Wt 335.0 lb

## 2023-05-21 DIAGNOSIS — R7303 Prediabetes: Secondary | ICD-10-CM

## 2023-05-21 DIAGNOSIS — E66813 Obesity, class 3: Secondary | ICD-10-CM | POA: Diagnosis not present

## 2023-05-21 DIAGNOSIS — R632 Polyphagia: Secondary | ICD-10-CM | POA: Diagnosis not present

## 2023-05-21 DIAGNOSIS — Z6841 Body Mass Index (BMI) 40.0 and over, adult: Secondary | ICD-10-CM | POA: Diagnosis not present

## 2023-05-21 DIAGNOSIS — E88819 Insulin resistance, unspecified: Secondary | ICD-10-CM

## 2023-05-21 MED ORDER — PHENTERMINE HCL 15 MG PO CAPS
ORAL_CAPSULE | ORAL | 0 refills | Status: AC
Start: 2023-05-21 — End: ?

## 2023-05-21 MED ORDER — METFORMIN HCL 1000 MG PO TABS
1000.0000 mg | ORAL_TABLET | Freq: Every day | ORAL | 0 refills | Status: AC
Start: 2023-05-21 — End: ?

## 2023-05-21 NOTE — Patient Instructions (Addendum)
 Continue metformin  1000 mg with dinner daily  Limit meals out Continue to work on meal planning, healthy food choices  Great job with gym workouts--goal of 3-4 days/ wk  Phentermine  15 mg 30 min before breakfast and add a 2nd dose at 1 pm daily (30-60 min before lunch)

## 2023-05-21 NOTE — Progress Notes (Signed)
 Office: [phone]  /  Fax: [phone]  WEIGHT SUMMARY AND BIOMETRICS  Starting Date: 10/10/22  Starting Weight: 343lb   Weight Lost Since Last Visit: 2lb   Vitals Temp: 97.8 F (36.6 C) BP: 128/82 Pulse Rate: 77 SpO2: 100 %   Body Composition  Body Fat %: 52 % Fat Mass (lbs): 174.4 lbs Muscle Mass (lbs): 153 lbs Total Body Water (lbs): 115 lbs Visceral Fat Rating : 15    HPI  Chief Complaint: OBESITY  Shirley Briggs is here to discuss her progress with her obesity treatment plan. She is on the following a lower carbohydrate, vegetable and lean protein rich diet plan and states she is following her eating plan approximately 90 % of the time. She states she is exercising 60 minutes 3 times per week.  Interval History:  Since last office visit she is down 2 lb She is down 1.8 lb of body fat and up 0.2 lb of muscle mass since last visit She is making it to the gym 2-3 days/ wk doing both cardio and weight training (hill walking) She is mindful of food choices, reducing carbs an sugar She has some appetite control on Phentermine  15 mg qAM feeling a little wear- off at night She is tolerating Phentermine  well with only some dry mouth She has been able to reduce portion sizes and snacking She has some occasional diarrhea from metformin  She hs a net weight loss of 8 lb in the past 7 mos  Pharmacotherapy: phentermine  15 mg qAM + metformin  1000 mg once daily PM  PHYSICAL EXAM:  Blood pressure 128/82, pulse 77, temperature 97.8 F (36.6 C), height 5' 10.5" (1.791 m), weight (!) 335 lb (152 kg), SpO2 100%. Body mass index is 47.39 kg/m.  General: She is overweight, cooperative, alert, well developed, and in no acute distress. PSYCH: Has normal mood, affect and thought process.   Lungs: Normal breathing effort, no conversational dyspnea.   ASSESSMENT AND PLAN  TREATMENT PLAN FOR OBESITY:  Recommended Dietary Goals  [name] is currently in the action stage of  change. As such, her goal is to continue weight management plan. She has agreed to the Category 3 Plan.  Behavioral Intervention  We discussed the following Behavioral Modification Strategies today: increasing lean protein intake to established goals, increasing fiber rich foods, increasing water intake , work on meal planning and preparation, keeping healthy foods at home, identifying sources and decreasing liquid calories, decreasing eating out or consumption of processed foods, and making healthy choices when eating convenient foods, practice mindfulness eating and understand the difference between hunger signals and cravings, avoiding temptations and identifying enticing environmental cues, continue to work on implementation of reduced calorie nutritional plan, and continue to work on maintaining a reduced calorie state, getting the recommended amount of protein, incorporating whole foods, making healthy choices, staying well hydrated and practicing mindfulness when eating..  Additional resources provided today: NA  Recommended Physical Activity Goals  Nitisha has been advised to work up to 150 minutes of moderate intensity aerobic activity a week and strengthening exercises 2-3 times per week for cardiovascular health, weight loss maintenance and preservation of muscle mass.   She has agreed to Increase the intensity, frequency or duration of strengthening exercises  and Increase the intensity, frequency or duration of aerobic exercises   Gym goal set for 3 days/ wk Add in walking at work -- she has started [employer] her on progress!  Pharmacotherapy changes for the treatment of obesity: change phentermine  to 15  mg bid  ASSOCIATED CONDITIONS ADDRESSED TODAY  Polyphagia Hunger and cravings improving on phentermine  15 mg qAM with a normal BP/ HR Avoid pregnancy on phentermine  Denies meal skipping but has reduced food volumes Due to wear- off effect at night, will move to bid dosing  with 2nd dose at 1 pm (1 hr before lunch) Continue to increase intake of water, lean protein and fiber with meals  Class 3 severe obesity due to excess calories with serious comorbidity and body mass index (BMI) of 45.0 to 49.9 in adult -     Phentermine  HCl; 1 capsule po 30 min before breakfast and 1 capsule po at 1 pm daily  Dispense: 60 capsule; Refill: 0  Prediabetes Lab Results  Component Value Date   HGBA1C 6.0 (H) 02/20/2023   She is doing well on metformin  1000 mg with dinner Some diarrhea on occasion Reviewed foods that may worsen GI upset on metformin  Repeat A1c, B12, BMP in 2 mos -     metFORMIN  HCl; Take 1 tablet (1,000 mg total) by mouth daily with supper.  Dispense: 90 tablet; Refill: 0  Insulin  resistance Repeat fasting insulin  in 2 mos Continue metformin  1000 mg with dinner Continue to work on reducing sugar and starches, increasing both cardio and resistance training exercise to 3+ days/ wk  Repeat fasting insulin  in 2 mos     She was informed of the importance of frequent follow up visits to maximize her success with intensive lifestyle modifications for her multiple health conditions.   ATTESTASTION STATEMENTS:  Reviewed by clinician on day of visit: allergies, medications, problem list, medical history, surgical history, family history, social history, and previous encounter notes pertinent to obesity diagnosis.   I have personally spent 30 minutes total time today in preparation, patient care, nutritional counseling and education,  and documentation for this visit, including the following: review of most recent clinical lab tests, prescribing medications/ refilling medications, reviewing medical assistant documentation, review and interpretation of bioimpedence results.     [name], D.O. DABFM, DABOM [facility] [address] [place], [place] [address] [phone]

## 2023-05-22 ENCOUNTER — Other Ambulatory Visit: Payer: Self-pay

## 2023-05-23 ENCOUNTER — Encounter: Payer: Self-pay | Admitting: Family Medicine

## 2023-05-23 ENCOUNTER — Ambulatory Visit: Payer: Medicaid Other | Admitting: Critical Care Medicine

## 2023-05-23 ENCOUNTER — Ambulatory Visit: Payer: Medicaid Other | Attending: Critical Care Medicine | Admitting: Family Medicine

## 2023-05-23 VITALS — BP 121/84 | HR 76 | Ht 70.5 in | Wt 338.6 lb

## 2023-05-23 DIAGNOSIS — R21 Rash and other nonspecific skin eruption: Secondary | ICD-10-CM | POA: Diagnosis not present

## 2023-05-23 DIAGNOSIS — L819 Disorder of pigmentation, unspecified: Secondary | ICD-10-CM

## 2023-05-23 DIAGNOSIS — R7303 Prediabetes: Secondary | ICD-10-CM

## 2023-05-23 NOTE — Patient Instructions (Signed)
 VISIT SUMMARY:  Today, you were seen for dry skin and hypopigmented patches on your face that have been present for the past two months. We discussed your symptoms, possible causes, and next steps for treatment. We also reviewed your prediabetes and acid reflux management, and discussed general health maintenance.  YOUR PLAN:  -DRY SKIN AND HYPOPIGMENTATION OF FACE: You have been experiencing dry skin and lighter patches on your face for the past two months. This could be due to vitiligo or a reaction to topical products. We recommend that you see a dermatologist for further evaluation. Additionally, please start using sunscreen regularly to protect your skin.  -PREDIABETES: Your prediabetes is being managed at the weight management clinic and is currently stable. Prediabetes means your blood sugar levels are higher than normal but not high enough to be classified as diabetes.  -ACID REFLUX: Your acid reflux is currently controlled with over-the-counter famotidine , and you have no new symptoms. Acid reflux occurs when stomach acid frequently flows back into the tube connecting your mouth and stomach.  -GENERAL HEALTH MAINTENANCE: You are due for a Pap smear. A Pap smear is a procedure to test for cervical cancer in women. We discussed scheduling options, and you should schedule this in six weeks.  INSTRUCTIONS:  Please schedule a Pap smear in six weeks. Additionally, follow up with a dermatologist for your dry skin and hypopigmented patches. Continue managing your prediabetes at the weight management clinic and take your medications for acid reflux as directed.

## 2023-05-23 NOTE — Progress Notes (Signed)
 Subjective:  Patient ID: Shirley Briggs, female    DOB: 11/12/1995  Age: 28 y.o. MRN: [id number]  CC: Medical Management of Chronic Issues (Dry skin)     Discussed the use of AI scribe software for clinical note transcription with the patient, who gave verbal consent to proceed.  History of Present Illness Shirley Briggs is a 28 year old female with history of prediabetes, obesity who presents with dry skin and hypopigmented patches on her face.  She experiences dry skin and hypopigmented patches on her face, particularly around the chin, mouth, cheeks, forehead, and nose, for the past two months. There is no associated itching, and similar patches are not present on other body parts. She has a history of acne from 2020 to 2021 but no recent issues. She uses Cerave healing ointment without improvement and has not used sunscreen since last summer. She applies witch hazel daily and questions if it contributes to her symptoms. Her ears and scalp are also dry,.She takes metformin  for prediabetes, allergy medications, and famotidine  for acid reflux. There is no family history of vitiligo or eczema.    Past Medical History:  Diagnosis Date   Anemia 11/06/2017   Anxiety    Depression    Iron deficiency anemia due to chronic blood loss    Pre-diabetes    Shortness of breath    Sleep apnea    Urticaria     Past Surgical History:  Procedure Laterality Date   CLEFT LIP REPAIR      Family History  Problem Relation Age of Onset   Cancer Mother    Hyperlipidemia Mother    Hypertension Mother    Anemia Mother    Anxiety disorder Mother    Depression Mother    Bipolar disorder Mother    Liver disease Mother    Alcohol abuse Mother    Drug abuse Mother    Obesity Father    Allergic rhinitis Sister    Anemia Maternal Grandmother    Heart disease Maternal Grandmother    Sickle cell anemia Neg Hx    Sickle cell trait Neg Hx     Social History   Socioeconomic  History   Marital status: Single    Spouse name: Not on file   Number of children: Not on file   Years of education: Not on file   Highest education level: Not on file  Occupational History   Not on file  Tobacco Use   Smoking status: Never    Passive exposure: Current   Smokeless tobacco: Never   Tobacco comments:    Aunt.   Vaping Use   Vaping status: Never Used  Substance and Sexual Activity   Alcohol use: Yes    Comment: Occassionally.   Drug use: Never   Sexual activity: Not Currently  Other Topics Concern   Not on file  Social History Narrative   Not on file   Social Drivers of Health   Financial Resource Strain: Not on file  Food Insecurity: No Food Insecurity (08/12/2019)   Hunger Vital Sign    Worried About Running Out of Food in the Last Year: Never true    Ran Out of Food in the Last Year: Never true  Transportation Needs: Unmet Transportation Needs (08/12/2019)   PRAPARE - Administrator, Civil Service (Medical): Yes    Lack of Transportation (Non-Medical): Yes  Physical Activity: Not on file  Stress: Not on file  Social Connections: Not on  file    No Known Allergies  Outpatient Medications Prior to Visit  Medication Sig Dispense Refill   cetirizine  (ZYRTEC ) 10 MG tablet Take 1 tablet (10 mg total) by mouth daily. 60 tablet 5   famotidine  (PEPCID ) 20 MG tablet Take 1 tablet (20 mg total) by mouth daily. 60 tablet 6   hydrOXYzine  (ATARAX ) 25 MG tablet Take 1 tablet (25 mg total) by mouth 3 (three) times daily as needed for itching. 60 tablet 0   metFORMIN  (GLUCOPHAGE ) 1000 MG tablet Take 1 tablet (1,000 mg total) by mouth daily with supper. 90 tablet 0   phentermine  15 MG capsule 1 capsule po 30 min before breakfast and 1 capsule po at 1 pm daily 60 capsule 0   Vitamin D , Ergocalciferol , (DRISDOL ) 1.25 MG (50000 UNIT) CAPS capsule Take 1 capsule (50,000 Units total) by mouth every 14 (fourteen) days. 5 capsule 0   No facility-administered  medications prior to visit.     ROS Review of Systems  Constitutional:  Negative for activity change and appetite change.  HENT:  Negative for sinus pressure and sore throat.   Respiratory:  Negative for chest tightness, shortness of breath and wheezing.   Cardiovascular:  Negative for chest pain and palpitations.  Gastrointestinal:  Negative for abdominal distention, abdominal pain and constipation.  Genitourinary: Negative.   Musculoskeletal: Negative.   Psychiatric/Behavioral:  Negative for behavioral problems and dysphoric mood.     Objective:  BP 121/84   Pulse 76   Ht 5' 10.5" (1.791 m)   Wt (!) 338 lb 9.6 oz (153.6 kg)   SpO2 98%   BMI 47.90 kg/m      05/23/2023   11:03 AM 05/21/2023    3:00 PM 04/18/2023    2:00 PM  BP/Weight  Systolic BP 121 128 127  Diastolic BP 84 82 79  Wt. (Lbs) 338.6 335 337  BMI 47.9 kg/m2 47.39 kg/m2 47.67 kg/m2      Physical Exam Constitutional:      Appearance: She is well-developed.  Cardiovascular:     Rate and Rhythm: Normal rate.     Heart sounds: Normal heart sounds. No murmur heard. Pulmonary:     Effort: Pulmonary effort is normal.     Breath sounds: Normal breath sounds. No wheezing or rales.  Chest:     Chest wall: No tenderness.  Abdominal:     General: Bowel sounds are normal. There is no distension.     Palpations: Abdomen is soft. There is no mass.     Tenderness: There is no abdominal tenderness.  Musculoskeletal:        General: Normal range of motion.     Right lower leg: No edema.     Left lower leg: No edema.  Neurological:     Mental Status: She is alert and oriented to person, place, and time.  Psychiatric:        Mood and Affect: Mood normal.        Latest Ref Rng & Units 02/20/2023    4:02 PM 11/14/2022   11:16 AM 10/10/2022    9:23 AM  CMP  Glucose 70 - 99 mg/dL 75  97  99   BUN 6 - 20 mg/dL 9  9  12    Creatinine 0.57 - 1.00 mg/dL 1.61  0.96  0.45   Sodium 134 - 144 mmol/L 138  139  140    Potassium 3.5 - 5.2 mmol/L 4.6  5.0  4.9   Chloride 96 -  106 mmol/L 102  103  103   CO2 20 - 29 mmol/L 21  23  22    Calcium 8.7 - 10.2 mg/dL 8.9  9.2  9.1   Total Protein 6.0 - 8.5 g/dL 7.1   7.3   Total Bilirubin 0.0 - 1.2 mg/dL 0.2   0.3   Alkaline Phos 44 - 121 IU/L 112   103   AST 0 - 40 IU/L 9   14   ALT 0 - 32 IU/L 11   10     Lipid Panel     Component Value Date/Time   CHOL 161 10/10/2022 0923   TRIG 96 10/10/2022 0923   HDL 40 10/10/2022 0923   CHOLHDL 4.0 10/10/2022 0923   LDLCALC 103 (H) 10/10/2022 0923    CBC    Component Value Date/Time   WBC 8.6 10/10/2022 0923   WBC 9.1 08/17/2018 1657   RBC 5.22 10/10/2022 0923   RBC 4.54 08/17/2018 1657   HGB 12.3 10/10/2022 0923   HCT 41.5 10/10/2022 0923   PLT 324 10/10/2022 0923   MCV 80 10/10/2022 0923   MCH 23.6 (L) 10/10/2022 0923   MCH 25.3 (L) 08/17/2018 1657   MCHC 29.6 (L) 10/10/2022 0923   MCHC 29.9 (L) 08/17/2018 1657   RDW 14.5 10/10/2022 0923   LYMPHSABS 2.1 10/10/2022 0923   MONOABS 0.5 08/17/2018 1657   EOSABS 0.1 10/10/2022 0923   BASOSABS 0.0 10/10/2022 9604    Lab Results  Component Value Date   HGBA1C 6.0 (H) 02/20/2023       Assessment & Plan Dry skin and hypopigmentation of face Persistent dry skin and facial hypopigmentation for two months. Differential includes vitiligo or reaction to topical products. No family history of vitiligo. - Refer to dermatology for evaluation. - Advise regular use of sunscreen.  Prediabetes - A1C of 6.0 Managed at weight management clinic. Stable condition.   General Health Maintenance Pap smear due. Discussed scheduling options. - Schedule Pap smear in six weeks.        No orders of the defined types were placed in this encounter.   Follow-up: No follow-ups on file.       Joaquin Mulberry, MD, FAAFP. [facility] [place], [place] [phone]   05/23/2023, 11:13 AM

## 2023-06-11 ENCOUNTER — Other Ambulatory Visit: Payer: Self-pay | Admitting: Physician Assistant

## 2023-06-11 ENCOUNTER — Encounter: Payer: Self-pay | Admitting: Family Medicine

## 2023-06-11 ENCOUNTER — Other Ambulatory Visit: Payer: Self-pay

## 2023-06-11 DIAGNOSIS — L309 Dermatitis, unspecified: Secondary | ICD-10-CM

## 2023-06-11 MED ORDER — TRIAMCINOLONE ACETONIDE 0.1 % EX CREA: 1.0000 | TOPICAL_CREAM | Freq: Two times a day (BID) | CUTANEOUS | 0 refills | Status: AC

## 2023-06-11 MED ORDER — TRIAMCINOLONE ACETONIDE 0.1 % EX CREA
1.0000 | TOPICAL_CREAM | Freq: Two times a day (BID) | CUTANEOUS | 0 refills | Status: DC
Start: 2023-06-11 — End: 2023-06-11
  Filled 2023-06-11: qty 80, 40d supply, fill #0

## 2023-06-12 ENCOUNTER — Other Ambulatory Visit: Payer: Self-pay

## 2023-06-13 ENCOUNTER — Other Ambulatory Visit: Payer: Self-pay

## 2023-06-18 ENCOUNTER — Encounter (INDEPENDENT_AMBULATORY_CARE_PROVIDER_SITE_OTHER): Payer: Self-pay

## 2023-06-19 ENCOUNTER — Ambulatory Visit: Admitting: Family Medicine

## 2023-07-02 ENCOUNTER — Ambulatory Visit (INDEPENDENT_AMBULATORY_CARE_PROVIDER_SITE_OTHER): Admitting: Family Medicine

## 2023-07-02 ENCOUNTER — Encounter: Payer: Self-pay | Admitting: Family Medicine

## 2023-07-02 VITALS — BP 112/65 | HR 82 | Temp 98.0°F | Ht 70.5 in | Wt 324.0 lb

## 2023-07-02 DIAGNOSIS — E611 Iron deficiency: Secondary | ICD-10-CM | POA: Diagnosis not present

## 2023-07-02 DIAGNOSIS — Z6841 Body Mass Index (BMI) 40.0 and over, adult: Secondary | ICD-10-CM

## 2023-07-02 DIAGNOSIS — R7303 Prediabetes: Secondary | ICD-10-CM

## 2023-07-02 DIAGNOSIS — R632 Polyphagia: Secondary | ICD-10-CM | POA: Diagnosis not present

## 2023-07-02 DIAGNOSIS — E559 Vitamin D deficiency, unspecified: Secondary | ICD-10-CM

## 2023-07-02 DIAGNOSIS — E66813 Obesity, class 3: Secondary | ICD-10-CM

## 2023-07-02 MED ORDER — VITAMIN D (ERGOCALCIFEROL) 1.25 MG (50000 UNIT) PO CAPS: 50000.0000 [IU] | ORAL_CAPSULE | ORAL | 0 refills | Status: DC

## 2023-07-02 MED ORDER — PHENTERMINE HCL 30 MG PO CAPS: ORAL_CAPSULE | ORAL | 0 refills | Status: DC

## 2023-07-02 NOTE — Progress Notes (Signed)
 Office: [phone]  /  Fax: [phone]  WEIGHT SUMMARY AND BIOMETRICS  Starting Date: 10/10/22  Starting Weight: 343lb   Weight Lost Since Last Visit: 11lb   Vitals Temp: 98 F (36.7 C) BP: 112/65 Pulse Rate: 82 SpO2: 98 %   Body Composition  Body Fat %: 50.4 % Fat Mass (lbs): 163.6 lbs Muscle Mass (lbs): 153.2 lbs Total Body Water (lbs): 107.4 lbs Visceral Fat Rating : 14    HPI  Chief Complaint: OBESITY  Shirley Briggs is here to discuss her progress with her obesity treatment plan. She is on the following a lower carbohydrate, vegetable and lean protein rich diet plan and states she is following her eating plan approximately 80 % of the time. She states she is exercising 30-60 minutes 3 times per week.  Interval History:  Since last office visit she is down 11 lb This gives her a net weight loss of 19 lb in the past 8 mos  This is a 5.5% TBW loss She has reduced carbs and getting in more lean protein and veggies She did move up Phentermine  to 15 mg twice a day (sometimes taking afternoon dose later) She has been struggling with insomnia She has been walking and working out more at the gym - 3 days/ wk She has an upcoming trip with friends to [place] is greatly reduced  Pharmacotherapy: Phentermine  15 mg bid   PHYSICAL EXAM:  Blood pressure 112/65, pulse 82, temperature 98 F (36.7 C), height 5' 10.5 (1.791 m), weight (!) 324 lb (147 kg), SpO2 98%. Body mass index is 45.83 kg/m.  General: She is overweight, cooperative, alert, well developed, and in no acute distress. PSYCH: Has normal mood, affect and thought process.   Lungs: Normal breathing effort, no conversational dyspnea.  ASSESSMENT AND PLAN  TREATMENT PLAN FOR OBESITY:  Recommended Dietary Goals  Shirley Briggs is currently in the action stage of change. As such, her goal is to continue weight management plan. She has agreed to practicing portion control and making smarter food  choices, such as increasing vegetables and decreasing simple carbohydrates. OK to stay low carb/ higher protein  Behavioral Intervention  We discussed the following Behavioral Modification Strategies today: increasing lean protein intake to established goals, increasing fiber rich foods, increasing water intake , work on meal planning and preparation, keeping healthy foods at home, practice mindfulness eating and understand the difference between hunger signals and cravings, work on managing stress, creating time for self-care and relaxation, avoiding temptations and identifying enticing environmental cues, continue to work on implementation of reduced calorie nutritional plan, and continue to work on maintaining a reduced calorie state, getting the recommended amount of protein, incorporating whole foods, making healthy choices, staying well hydrated and practicing mindfulness when eating..  Additional resources provided today: NA  Recommended Physical Activity Goals  Shirley Briggs has been advised to work up to 150 minutes of moderate intensity aerobic activity a week and strengthening exercises 2-3 times per week for cardiovascular health, weight loss maintenance and preservation of muscle mass.   She has agreed to Increase the intensity, frequency or duration of strengthening exercises  and Increase the intensity, frequency or duration of aerobic exercises    Pharmacotherapy changes for the treatment of obesity: change Phentermine  15 mg bid to Phentermine  30 mg qAM  ASSOCIATED CONDITIONS ADDRESSED TODAY  Prediabetes Doing well on metformin  1000 mg daily without adverse SE Actively working on weight loss and healthy lifestyle changes Check BMP, B12, A1c, fasting insulin  next  visit  Vitamin D  deficiency Last vitamin D  Lab Results  Component Value Date   VD25OH 42.0 02/20/2023   Doing well on RX vitamin D  q 14 days Repeat lab next visit -     Vitamin D  (Ergocalciferol ); Take 1 capsule  (50,000 Units total) by mouth every 14 (fourteen) days.  Dispense: 5 capsule; Refill: 0  Class 3 severe obesity due to excess calories with serious comorbidity and body mass index (BMI) of 45.0 to 49.9 in adult Due to insomnia SE from PM dose of Phentermine , will move to AM only, increasing to 30 mg capsule.  Tracking menstrual cycle and knows to avoid pregnancy while on this medication.  BP and HR are WNL -     Phentermine  HCl; 1 capsule po 30 min before breakfast daily  Dispense: 30 capsule; Refill: 0  Polyphagia Improving with smaller portions, less cravings and snacking on Phentermine   Iron deficiency Taking a women's MVI daily Iron has previously been low Repeat level with next labs    She was informed of the importance of frequent follow up visits to maximize her success with intensive lifestyle modifications for her multiple health conditions.   ATTESTASTION STATEMENTS:  Reviewed by clinician on day of visit: allergies, medications, problem list, medical history, surgical history, family history, social history, and previous encounter notes pertinent to obesity diagnosis.   I have personally spent 30 minutes total time today in preparation, patient care, nutritional counseling and education,  and documentation for this visit, including the following: review of most recent clinical lab tests, prescribing medications/ refilling medications, reviewing medical assistant documentation, review and interpretation of bioimpedence results.     [name], D.O. DABFM, DABOM [facility] [address] [place], [place] [address] [phone]

## 2023-07-09 ENCOUNTER — Ambulatory Visit: Admitting: Family Medicine

## 2023-07-10 DIAGNOSIS — L718 Other rosacea: Secondary | ICD-10-CM | POA: Diagnosis not present

## 2023-07-10 DIAGNOSIS — L218 Other seborrheic dermatitis: Secondary | ICD-10-CM | POA: Diagnosis not present

## 2023-07-30 ENCOUNTER — Ambulatory Visit: Admitting: Family Medicine

## 2023-08-13 ENCOUNTER — Ambulatory Visit: Admitting: Family Medicine

## 2023-08-14 ENCOUNTER — Telehealth: Payer: Self-pay | Admitting: Family Medicine

## 2023-08-14 ENCOUNTER — Ambulatory Visit (INDEPENDENT_AMBULATORY_CARE_PROVIDER_SITE_OTHER): Admitting: Family Medicine

## 2023-08-14 VITALS — BP 124/82 | HR 89 | Temp 98.7°F | Ht 70.5 in | Wt 328.0 lb

## 2023-08-14 DIAGNOSIS — E66813 Obesity, class 3: Secondary | ICD-10-CM

## 2023-08-14 DIAGNOSIS — E611 Iron deficiency: Secondary | ICD-10-CM

## 2023-08-14 DIAGNOSIS — E559 Vitamin D deficiency, unspecified: Secondary | ICD-10-CM

## 2023-08-14 DIAGNOSIS — Z6841 Body Mass Index (BMI) 40.0 and over, adult: Secondary | ICD-10-CM

## 2023-08-14 DIAGNOSIS — R7303 Prediabetes: Secondary | ICD-10-CM

## 2023-08-14 DIAGNOSIS — R5383 Other fatigue: Secondary | ICD-10-CM

## 2023-08-14 MED ORDER — PHENTERMINE HCL 37.5 MG PO TABS: ORAL_TABLET | ORAL | 0 refills | Status: DC

## 2023-08-14 MED ORDER — VITAMIN D (ERGOCALCIFEROL) 1.25 MG (50000 UNIT) PO CAPS: 50000.0000 [IU] | ORAL_CAPSULE | ORAL | 0 refills | Status: DC

## 2023-08-14 NOTE — Patient Instructions (Signed)
 Aim for gym workouts 3 days/ wk Work on increasing walking   [employer] out Phentermine  30 mg capsules then move up to 37.5 mg each morning, 30 min before breakfast  Labs today Will send results to MyChart tomorrow  Resume Cat 3 meal plan Work on grocery shopping Get some fruits and veggies Keep some easy frozen meals at home/ bring to work [employer] well water/ sugar free drinks

## 2023-08-14 NOTE — Telephone Encounter (Addendum)
 Called patient to confirm upcoming appointment 08/15/2023 at 9:30 am. Patient appointment has been successfully confirmed

## 2023-08-14 NOTE — Progress Notes (Signed)
 Office: [phone]  /  Fax: [phone]  WEIGHT SUMMARY AND BIOMETRICS  Starting Date: 10/10/22  Starting Weight: 343lb   Weight Lost Since Last Visit: 0lb   Vitals Temp: 98.7 F (37.1 C) BP: 124/82 Pulse Rate: 89 SpO2: 98 %   Body Composition  Body Fat %: 49.2 % Fat Mass (lbs): 161.6 lbs Muscle Mass (lbs): 158.4 lbs Total Body Water (lbs): 113.8 lbs Visceral Fat Rating : 14    HPI  Chief Complaint: OBESITY  Shirley Briggs is here to discuss her progress with her obesity treatment plan. She is on the the Category 2 Plan and states she is following her eating plan approximately 0 % of the time. She states she is exercising 0 minutes 0 times per week.  Interval History:  Since last office visit she is up 4 lb She is up 5.2 pounds of muscle mass and down 2 pounds of body fat since last visit This gives her a net weight loss of 15 pounds in the past 9 months of medically supervised weight management This is a 4.3% total body weight loss She did go to [place] and admits to getting a bit off track with her diet She is struggling with water intake She has been eating on the run She has her aunt at her house, with heavy foods, eating out and bad snack choices She plans to grocery shop and plan out meals She is taking Phentermine  30 mg in the AM, changed from phentermine  15 mg twice daily Portions are better but choices are poor She denies adverse SE from Phentermine  She has been working a later shift which limits her gym workouts, looking for a new job  Pharmacotherapy: metformin  1000 mg daily and phentermine  30 mg each morning  PHYSICAL EXAM:  Blood pressure 124/82, pulse 89, temperature 98.7 F (37.1 C), height 5' 10.5 (1.791 m), weight (!) 328 lb (148.8 kg), SpO2 98%. Body mass index is 46.4 kg/m.  General: She is overweight, cooperative, alert, well developed, and in no acute distress. PSYCH: Has normal mood, affect and thought process.   Lungs: Normal  breathing effort, no conversational dyspnea.   ASSESSMENT AND PLAN  TREATMENT PLAN FOR OBESITY:  Recommended Dietary Goals  [name] is currently in the action stage of change. As such, her goal is to continue weight management plan. She has agreed to the Category 3 Plan.  Behavioral Intervention  We discussed the following Behavioral Modification Strategies today: increasing lean protein intake to established goals, increasing fiber rich foods, increasing water intake , work on meal planning and preparation, keeping healthy foods at home, avoiding temptations and identifying enticing environmental cues, continue to practice mindfulness when eating, planning for success, and continue to work on maintaining a reduced calorie state, getting the recommended amount of protein, incorporating whole foods, making healthy choices, staying well hydrated and practicing mindfulness when eating..  Additional resources provided today: NA  Recommended Physical Activity Goals  Tranika has been advised to work up to 150 minutes of moderate intensity aerobic activity a week and strengthening exercises 2-3 times per week for cardiovascular health, weight loss maintenance and preservation of muscle mass.   She has agreed to Start aerobic activity with a goal of 150 minutes a week at moderate intensity.  Reviewed diet and exercise change goals on after visit summary  Pharmacotherapy changes for the treatment of obesity: Increase phentermine  to 37.5 mg tab 30 minutes before breakfast daily  ASSOCIATED CONDITIONS ADDRESSED TODAY  Iron deficiency Lab Results  Component Value Date   IRON 67 03/21/2021   TIBC 341 03/21/2021   FERRITIN 21 10/10/2022  Last iron level borderline low.  She is currently not taking iron supplements but has complaints of fatigue and a history of heavy menses.  Recheck iron levels today  -     Ferritin -     Iron and TIBC  Vitamin D  deficiency Last vitamin D  Lab Results   Component Value Date   VD25OH 42.0 02/20/2023  She has been taking vitamin D  50,000 IU q. 14 days.  Due for recheck level today.  Energy level is starting to improve.  -     VITAMIN D  25 Hydroxy (Vit-D Deficiency, Fractures) -     Vitamin D  (Ergocalciferol ); Take 1 capsule (50,000 Units total) by mouth every 14 (fourteen) days.  Dispense: 5 capsule; Refill: 0  Class 3 severe obesity due to excess calories with serious comorbidity and body mass index (BMI) of 45.0 to 49.9 in adult Blood pressure and heart rate are within normal limits on phentermine  37.5 mg tab each morning.  She is not at risk for pregnancy.  She denies adverse side effects from the 30 mg dose but is seeing an adequate weight loss to continue this dose.  Will increase to phentermine  37.5 mg tab 30 minutes before breakfast daily.  Work on compliance on prescribed diet and plan for introducing more regular exercise.  If not seeing adequate weight loss of 2 to 4 pounds in the next 1 month, will discontinue phentermine .  -     Phentermine  HCl; 1 tab po 30 min before breakfast daily  Dispense: 30 tablet; Refill: 0  Other fatigue -     Vitamin B12 At risk for B12 deficiency on metformin  1000 mg once daily dose  Prediabetes Lab Results  Component Value Date   HGBA1C 6.0 (H) 02/20/2023  Last A1c in the prediabetic range at 6.0 currently on metformin  1000 mg once daily and tolerating well.  She is down 4.3% of her total body weight in the past 9 months of medically supervised weight management.  She has number for improvement with compliance with diet and exercise changes.  Repeat labs today.  -     Hemoglobin A1c -     Basic metabolic panel with GFR      She was informed of the importance of frequent follow up visits to maximize her success with intensive lifestyle modifications for her multiple health conditions.   ATTESTASTION STATEMENTS:  Reviewed by clinician on day of visit: allergies, medications, problem list,  medical history, surgical history, family history, social history, and previous encounter notes pertinent to obesity diagnosis.   I have personally spent 30 minutes total time today in preparation, patient care, nutritional counseling and education,  and documentation for this visit, including the following: review of most recent clinical lab tests, prescribing medications/ refilling medications, reviewing medical assistant documentation, review and interpretation of bioimpedence results.     Darice Haddock, D.O. DABFM, DABOM [facility] [address] [place], [place] [address] [phone]

## 2023-08-15 ENCOUNTER — Ambulatory Visit: Payer: Self-pay | Admitting: Family Medicine

## 2023-08-15 ENCOUNTER — Ambulatory Visit: Admitting: Family Medicine

## 2023-08-15 LAB — FERRITIN: Ferritin: 32 ng/mL (ref 15–150)

## 2023-08-15 LAB — BASIC METABOLIC PANEL WITH GFR
BUN/Creatinine Ratio: 14 (ref 9–23)
BUN: 9 mg/dL (ref 6–20)
CO2: 22 mmol/L (ref 20–29)
Calcium: 8.9 mg/dL (ref 8.7–10.2)
Chloride: 102 mmol/L (ref 96–106)
Creatinine, Ser: 0.64 mg/dL (ref 0.57–1.00)
Glucose: 113 mg/dL — ABNORMAL HIGH (ref 70–99)
Potassium: 4.5 mmol/L (ref 3.5–5.2)
Sodium: 139 mmol/L (ref 134–144)
eGFR: 124 mL/min/1.73 (ref >59)

## 2023-08-15 LAB — HEMOGLOBIN A1C
Est. average glucose Bld gHb Est-mCnc: 117 mg/dL
Hgb A1c MFr Bld: 5.7 % — ABNORMAL HIGH (ref 4.8–5.6)

## 2023-08-15 LAB — IRON AND TIBC
Iron Saturation: 9 % — CL (ref 15–55)
Iron: 30 ug/dL (ref 27–159)
Total Iron Binding Capacity: 321 ug/dL (ref 250–450)
UIBC: 291 ug/dL (ref 131–425)

## 2023-08-15 LAB — VITAMIN D 25 HYDROXY (VIT D DEFICIENCY, FRACTURES): Vit D, 25-Hydroxy: 24.8 ng/mL — ABNORMAL LOW (ref 30.0–100.0)

## 2023-08-15 LAB — VITAMIN B12: Vitamin B-12: 532 pg/mL (ref 232–1245)

## 2023-09-11 ENCOUNTER — Ambulatory Visit: Admitting: Family Medicine

## 2023-09-11 ENCOUNTER — Ambulatory Visit: Payer: Self-pay | Admitting: Family Medicine

## 2023-12-30 ENCOUNTER — Encounter: Payer: Self-pay | Admitting: Family Medicine

## 2023-12-30 ENCOUNTER — Ambulatory Visit: Admitting: Family Medicine

## 2023-12-30 VITALS — BP 119/67 | HR 76 | Temp 98.0°F | Ht 70.5 in | Wt 351.0 lb

## 2023-12-30 DIAGNOSIS — E611 Iron deficiency: Secondary | ICD-10-CM | POA: Diagnosis not present

## 2023-12-30 DIAGNOSIS — Z6841 Body Mass Index (BMI) 40.0 and over, adult: Secondary | ICD-10-CM

## 2023-12-30 DIAGNOSIS — G4733 Obstructive sleep apnea (adult) (pediatric): Secondary | ICD-10-CM | POA: Diagnosis not present

## 2023-12-30 DIAGNOSIS — E559 Vitamin D deficiency, unspecified: Secondary | ICD-10-CM | POA: Diagnosis not present

## 2023-12-30 DIAGNOSIS — R7303 Prediabetes: Secondary | ICD-10-CM | POA: Diagnosis not present

## 2023-12-30 MED ORDER — ZEPBOUND 2.5 MG/0.5ML ~~LOC~~ SOAJ: 2.5000 mg | SUBCUTANEOUS | 0 refills | Status: AC

## 2023-12-30 NOTE — Patient Instructions (Addendum)
 Check out Chat GPT app to create a new meal plan:  1600 calorie low carb meal plan You can swap out meals and snacks but keep calories constant and stay low carb/ low sugar  Plan to start Zepbound  2.5 mg weekly injection Check out [email or web address]  Think about your plan to add in exercise over the next month Stay on oral iron daily

## 2023-12-30 NOTE — Progress Notes (Signed)
 Office: [phone]  /  Fax: [phone]  WEIGHT SUMMARY AND BIOMETRICS  Starting Date: 10/10/22  Starting Weight: 343lb   Weight Lost Since Last Visit: 0lb   Vitals Temp: 98 F (36.7 C) BP: 119/67 Pulse Rate: 76 SpO2: 98 %   Body Composition  Body Fat %: 53.6 % Fat Mass (lbs): 188.4 lbs Muscle Mass (lbs): 154.8 lbs Total Body Water (lbs): 117.2 lbs Visceral Fat Rating : 17    HPI  Chief Complaint: OBESITY  Shirley Briggs is here to discuss her progress with her obesity treatment plan. She is on the the Category 2 Plan and states she is following her eating plan approximately 0 % of the time. She states she is walking some.   Interval History:  Since last office visit she is up 23 lb She was last seen 4 mos ago She lost her job and was pretty stressed for a few months Had rebound appetite stopping Phentermine , was home more and was stress eating She has a new job at [employer] with more normal hours She is ready to get back on track. She has not yet started an exercise plan She is eating off her cat 3 meal plan She has a net weight gain of 8 lb in 14 mos  Pharmacotherapy: none  PHYSICAL EXAM:  Blood pressure 119/67, pulse 76, temperature 98 F (36.7 C), height 5' 10.5 (1.791 m), weight (!) 351 lb (159.2 kg), SpO2 98%. Body mass index is 49.65 kg/m.  General: She is overweight, cooperative, alert, well developed, and in no acute distress. PSYCH: Has normal mood, affect and thought process.   Lungs: Normal breathing effort, no conversational dyspnea.  ASSESSMENT AND PLAN  TREATMENT PLAN FOR OBESITY:  Recommended Dietary Goals  Shirley Briggs is currently in the action stage of change. As such, her goal is to continue weight management plan. She has agreed to keeping a food journal and adhering to recommended goals of 1600 calories and 100 g of  protein. - use Chat GPT to create at 1600 cal low carb meal plan, demonstrated today  Behavioral Intervention  We  discussed the following Behavioral Modification Strategies today: increasing lean protein intake to established goals, decreasing simple carbohydrates , increasing vegetables, increasing water intake , work on meal planning and preparation, keeping healthy foods at home, identifying sources and decreasing liquid calories, decreasing eating out or consumption of processed foods, and making healthy choices when eating convenient foods, practice mindfulness eating and understand the difference between hunger signals and cravings, work on managing stress, creating time for self-care and relaxation, avoiding temptations and identifying enticing environmental cues, and planning for success.  Additional resources provided today: NA  Recommended Physical Activity Goals  Shirley Briggs has been advised to work up to 150 minutes of moderate intensity aerobic activity a week and strengthening exercises 2-3 times per week for cardiovascular health, weight loss maintenance and preservation of muscle mass.   She has agreed to Think about enjoyable ways to increase daily physical activity and overcoming barriers to exercise and Increase physical activity in their day and reduce sedentary time (increase NEAT). Firm up plan for exercise to start in the next month  Pharmacotherapy changes for the treatment of obesity: begin Zepbound  2.5 mg weekly for OSA indication  ASSOCIATED CONDITIONS ADDRESSED TODAY  OSA on CPAP On CPAP nightly for OSA Continue to work on weight reduction and improving sleep at night She is a good candidate for Zepbound  2.5 mg weekly Patient denies a personal or family history  of pancreatitis, medullary thyroid  carcinoma or multiple endocrine neoplasia type II. Recommend reviewing pen training video online.  -     Zepbound ; Inject 2.5 mg into the skin once a week.  Dispense: 2 mL; Refill: 0  Morbid obesity (HCC) Patient was counseled on the importance of maintaining healthy lifestyle habits,  including balanced nutrition, regular physical activity, and behavioral modifications, while taking antiobesity medication.  Patient verbalized understanding that medication is an adjunct to, not a replacement for, lifestyle changes and that the long-term success and weight maintenance depend on continued adherence to these strategies.  Avoid pregnancy while on Zepbound   BMI 45.0-49.9, adult (HCC)  Iron deficiency Taking OTC iron supplement daily Has DUB and hx of low iron C/o fatigue and cold intolerance Repeat iron level and CBC next visit  Vitamin D  deficiency Last vitamin D  Lab Results  Component Value Date   VD25OH 24.8 (L) 08/14/2023   She has been taking RX vitamin D  weekly (ran out) Check level next visit  Prediabetes Lab Results  Component Value Date   HGBA1C 5.7 (H) 08/14/2023  Continue to work on limiting intake of sugar and starch Plan for repeat A1c next visit      She was informed of the importance of frequent follow up visits to maximize her success with intensive lifestyle modifications for her multiple health conditions.   ATTESTASTION STATEMENTS:  Reviewed by clinician on day of visit: allergies, medications, problem list, medical history, surgical history, family history, social history, and previous encounter notes pertinent to obesity diagnosis.   I have personally spent 30 minutes total time today in preparation, patient care, nutritional counseling and education,  and documentation for this visit, including the following: review of most recent clinical lab tests, prescribing medications/ refilling medications, reviewing medical assistant documentation, review and interpretation of bioimpedence results.     Darice Haddock, D.O. DABFM, DABOM [facility] [address] [place], [place] [address] [phone]

## 2024-01-02 ENCOUNTER — Telehealth: Payer: Self-pay | Admitting: *Deleted

## 2024-01-02 ENCOUNTER — Ambulatory Visit: Admitting: Family Medicine

## 2024-01-02 NOTE — Telephone Encounter (Signed)
 Prior authorizations done via cover my meds for patients Zepbound . Waiting on determination.

## 2024-01-06 NOTE — Telephone Encounter (Signed)
 PA for Zepbound  2.5 has been denied.   Message from Plan Denied. While there is no prior authorization needed, this medication does require [facility] Enrollment for coverage. Please contact [facility] directly for enrollment at [phone].

## 2024-02-03 ENCOUNTER — Ambulatory Visit: Admitting: Family Medicine

## 2024-02-03 ENCOUNTER — Encounter: Payer: Self-pay | Admitting: Family Medicine

## 2024-02-03 VITALS — BP 134/85 | HR 78 | Temp 97.9°F | Ht 70.5 in | Wt 356.0 lb

## 2024-02-03 DIAGNOSIS — R7303 Prediabetes: Secondary | ICD-10-CM | POA: Diagnosis not present

## 2024-02-03 DIAGNOSIS — Z6841 Body Mass Index (BMI) 40.0 and over, adult: Secondary | ICD-10-CM | POA: Diagnosis not present

## 2024-02-03 DIAGNOSIS — Z7289 Other problems related to lifestyle: Secondary | ICD-10-CM | POA: Diagnosis not present

## 2024-02-03 DIAGNOSIS — R635 Abnormal weight gain: Secondary | ICD-10-CM

## 2024-02-03 DIAGNOSIS — E559 Vitamin D deficiency, unspecified: Secondary | ICD-10-CM | POA: Diagnosis not present

## 2024-02-03 DIAGNOSIS — E611 Iron deficiency: Secondary | ICD-10-CM | POA: Diagnosis not present

## 2024-02-03 DIAGNOSIS — Z9189 Other specified personal risk factors, not elsewhere classified: Secondary | ICD-10-CM

## 2024-02-03 NOTE — Progress Notes (Signed)
 "  Office: [phone]  /  Fax: [phone]  WEIGHT SUMMARY AND BIOMETRICS  Starting Date: 10/10/22  Starting Weight: 343lb   Weight Lost Since Last Visit: 0lb   Vitals Temp: 97.9 F (36.6 C) BP: 134/85 Pulse Rate: 78 SpO2: 98 %   Body Composition  Body Fat %: 53 % Fat Mass (lbs): 188.8 lbs Muscle Mass (lbs): 159.2 lbs Total Body Water (lbs): 115.2 lbs Visceral Fat Rating : 17    HPI  Chief Complaint: OBESITY  Shirley Briggs is here to discuss her progress with her obesity treatment plan. She is on the the Category 2 Plan and states she is following her eating plan approximately 50 % of the time. She states she is exercising 0 minutes 0 times per week.  Interval History:  Since last office visit she is up 5 lb This gives her a net weight gain of 13 lb in the past 15 mos of medically supervised weight management She came off Phentermine  last year after not seeing weight loss She will be going through [employer] for initiating Zepbound  She is liking her new job schedule She has not added in regular exercise She has tried to plan out meals better and make better food choices but is still having SSBs and is not tracking calories  Pharmacotherapy: none (RX was sent for Zepbound  2.5 mg last visit)  PHYSICAL EXAM:  Blood pressure 134/85, pulse 78, temperature 97.9 F (36.6 C), height 5' 10.5 (1.791 m), weight (!) 356 lb (161.5 kg), SpO2 98%. Body mass index is 50.36 kg/m.  General: She is overweight, cooperative, alert, well developed, and in no acute distress. PSYCH: Has normal mood, affect and thought process.   Lungs: Normal breathing effort, no conversational dyspnea.  ASSESSMENT AND PLAN  TREATMENT PLAN FOR OBESITY:  Recommended Dietary Goals  Shirley Briggs is currently in the action stage of change. As such, her goal is to continue weight management plan. She has agreed to keeping a food journal and adhering to recommended goals of 1600 calories and 100 g of   protein.  Behavioral Intervention  We discussed the following Behavioral Modification Strategies today: increasing lean protein intake to established goals, increasing fiber rich foods, increasing water intake , work on meal planning and preparation, work on counselling psychologist calories using tracking application, keeping healthy foods at home, work on managing stress, creating time for self-care and relaxation, avoiding temptations and identifying enticing environmental cues, continue to practice mindfulness when eating, and planning for success.  Additional resources provided today: NA  Recommended Physical Activity Goals  Shirley Briggs has been advised to work up to 150 minutes of moderate intensity aerobic activity a week and strengthening exercises 2-3 times per week for cardiovascular health, weight loss maintenance and preservation of muscle mass.   She has agreed to Start aerobic activity with a goal of 150 minutes a week at moderate intensity.   Pharmacotherapy changes for the treatment of obesity: none  ASSOCIATED CONDITIONS ADDRESSED TODAY  Iron deficiency Lab Results  Component Value Date   IRON 30 08/14/2023   TIBC 321 08/14/2023   FERRITIN 32 08/14/2023   She is not taking a daily iron supplement C/o fatigue Denies intake of vegan or vegetarian diet Has regular menses Repeat labs today -     Ferritin -     CBC -     Iron and TIBC  Vitamin D  deficiency Last vitamin D  Lab Results  Component Value Date   VD25OH 24.8 (L) 08/14/2023  She  has not been taking a vitamin D  supplement Repeat lab today C/o fatigue  -     VITAMIN D  25 Hydroxy (Vit-D Deficiency, Fractures)  Prediabetes Lab Results  Component Value Date   HGBA1C 5.7 (H) 08/14/2023  Repeat A1c today She has struggled to see weight loss over 15 mos of medially supervised weight management She was on metformin  last year without problem Resume a low sugar/ lower starch diet with lean protein and fiber  at meals Ramp up walking time -     Hemoglobin A1c -     Lipid panel  Weight gain -     TSH Rfx on Abnormal to Free T4 -     Comprehensive metabolic panel with GFR  Morbid obesity (HCC) Starting Zepbound  2.5 mg weekly thru [facility] for work reasons  BMI 50.0-59.9, adult (HCC)  Sedentary lifestyle Unchanged We talked about adding in 30 min of walking over lunch break until time / weather changes      She was informed of the importance of frequent follow up visits to maximize her success with intensive lifestyle modifications for her multiple health conditions.   ATTESTASTION STATEMENTS:  Reviewed by clinician on day of visit: allergies, medications, problem list, medical history, surgical history, family history, social history, and previous encounter notes pertinent to obesity diagnosis.   I have personally spent 30 minutes total time today in preparation, patient care, nutritional counseling and education,  and documentation for this visit, including the following: review of most recent clinical lab tests, prescribing medications/ refilling medications, reviewing medical assistant documentation, review and interpretation of bioimpedence results.     Shirley Briggs, D.O. DABFM, DABOM [facility] [address] [place], [place] [address] [phone] "

## 2024-02-03 NOTE — Patient Instructions (Addendum)
 Add in 30 min of outdoor walks 5 days/ wk over the next month  Check out AI meal plans keep 1600 calories (no cottage cheese) Great job with meal planning, cooking at home, increasing fruits and veggies  Plan a snack around 3 pm: A protein bar like [employer] or [employer] OR A [employer] meat sticks and a string cheese OR a protein shake  Plan to start on Zepbound  once weekly thru [employer] today Will send results to MyChart tomorrow  Aim for 8 hrs of sleep at night

## 2024-02-04 LAB — IRON AND TIBC
Iron Saturation: 11 % — ABNORMAL LOW (ref 15–55)
Iron: 32 ug/dL (ref 27–159)
Total Iron Binding Capacity: 298 ug/dL (ref 250–450)
UIBC: 266 ug/dL (ref 131–425)

## 2024-02-04 LAB — COMPREHENSIVE METABOLIC PANEL WITH GFR
ALT: 12 IU/L (ref 0–32)
AST: 15 IU/L (ref 0–40)
Albumin: 3.7 g/dL — ABNORMAL LOW (ref 4.0–5.0)
Alkaline Phosphatase: 106 IU/L (ref 41–116)
BUN/Creatinine Ratio: 17 (ref 9–23)
BUN: 10 mg/dL (ref 6–20)
Bilirubin Total: 0.3 mg/dL (ref 0.0–1.2)
CO2: 23 mmol/L (ref 20–29)
Calcium: 8.8 mg/dL (ref 8.7–10.2)
Chloride: 103 mmol/L (ref 96–106)
Creatinine, Ser: 0.59 mg/dL (ref 0.57–1.00)
Globulin, Total: 2.8 g/dL (ref 1.5–4.5)
Glucose: 100 mg/dL — ABNORMAL HIGH (ref 70–99)
Potassium: 5.2 mmol/L (ref 3.5–5.2)
Sodium: 139 mmol/L (ref 134–144)
Total Protein: 6.5 g/dL (ref 6.0–8.5)
eGFR: 126 mL/min/1.73

## 2024-02-04 LAB — LIPID PANEL
Chol/HDL Ratio: 3.4 ratio (ref 0.0–4.4)
Cholesterol, Total: 146 mg/dL (ref 100–199)
HDL: 43 mg/dL
LDL Chol Calc (NIH): 89 mg/dL (ref 0–99)
Triglycerides: 69 mg/dL (ref 0–149)
VLDL Cholesterol Cal: 14 mg/dL (ref 5–40)

## 2024-02-04 LAB — CBC
Hematocrit: 40.9 % (ref 34.0–46.6)
Hemoglobin: 12.6 g/dL (ref 11.1–15.9)
MCH: 25 pg — ABNORMAL LOW (ref 26.6–33.0)
MCHC: 30.8 g/dL — ABNORMAL LOW (ref 31.5–35.7)
MCV: 81 fL (ref 79–97)
Platelets: 316 x10E3/uL (ref 150–450)
RBC: 5.04 x10E6/uL (ref 3.77–5.28)
RDW: 13.5 % (ref 11.7–15.4)
WBC: 9.1 x10E3/uL (ref 3.4–10.8)

## 2024-02-04 LAB — TSH RFX ON ABNORMAL TO FREE T4: TSH: 1.99 u[IU]/mL (ref 0.450–4.500)

## 2024-02-04 LAB — VITAMIN D 25 HYDROXY (VIT D DEFICIENCY, FRACTURES): Vit D, 25-Hydroxy: 24.6 ng/mL — ABNORMAL LOW (ref 30.0–100.0)

## 2024-02-04 LAB — HEMOGLOBIN A1C
Est. average glucose Bld gHb Est-mCnc: 120 mg/dL
Hgb A1c MFr Bld: 5.8 % — ABNORMAL HIGH (ref 4.8–5.6)

## 2024-02-04 LAB — FERRITIN: Ferritin: 38 ng/mL (ref 15–150)

## 2024-02-05 ENCOUNTER — Ambulatory Visit: Payer: Self-pay | Admitting: Family Medicine

## 2024-02-19 ENCOUNTER — Telehealth: Payer: Self-pay | Admitting: Family Medicine

## 2024-02-19 NOTE — Telephone Encounter (Signed)
 Pt is calling to get advise about a mediation. Pt wants to know if you have heard of Orlistat. Pt will like to speak with you concerning this medication.

## 2024-02-20 NOTE — Telephone Encounter (Signed)
 Spoke to patient about the medication. Patient is going to reach back out to the [facility] through her insurance to find out exactly what she needs before they will let her get the Zepbound .  I also submitted a PA through her Medicaid since [employer] denied it unless she goes through [employer].

## 2024-03-03 ENCOUNTER — Ambulatory Visit: Admitting: Family Medicine

## 2024-03-12 ENCOUNTER — Ambulatory Visit: Admitting: Family Medicine
